# Patient Record
Sex: Male | Born: 1960 | Race: White | Hispanic: No | Marital: Married | Smoking: Former smoker
Health system: Southern US, Community
[De-identification: ages and names within clinical notes are randomized; demographics above are authoritative.]

## PROBLEM LIST (undated history)

## (undated) HISTORY — PX: OTHER SURGICAL HISTORY: SHX169

---

## 2001-12-22 ENCOUNTER — Emergency Department (HOSPITAL_COMMUNITY): Admission: EM | Admit: 2001-12-22 | Discharge: 2001-12-22 | Payer: Self-pay | Admitting: Emergency Medicine

## 2001-12-22 ENCOUNTER — Encounter: Payer: Self-pay | Admitting: Emergency Medicine

## 2014-08-17 ENCOUNTER — Emergency Department (HOSPITAL_COMMUNITY): Payer: BLUE CROSS/BLUE SHIELD

## 2014-08-17 ENCOUNTER — Encounter (HOSPITAL_COMMUNITY): Payer: Self-pay | Admitting: *Deleted

## 2014-08-17 ENCOUNTER — Emergency Department (HOSPITAL_COMMUNITY)
Admission: EM | Admit: 2014-08-17 | Discharge: 2014-08-17 | Disposition: A | Discharge status: Home / Self Care | Payer: BLUE CROSS/BLUE SHIELD | Attending: Emergency Medicine | Admitting: Emergency Medicine

## 2014-08-17 DIAGNOSIS — B349 Viral infection, unspecified: Secondary | ICD-10-CM | POA: Insufficient documentation

## 2014-08-17 DIAGNOSIS — Z720 Tobacco use: Secondary | ICD-10-CM | POA: Insufficient documentation

## 2014-08-17 DIAGNOSIS — R1110 Vomiting, unspecified: Secondary | ICD-10-CM | POA: Diagnosis present

## 2014-08-17 LAB — COMPREHENSIVE METABOLIC PANEL
ALK PHOS: 117 U/L (ref 38–126)
ALT: 25 U/L (ref 17–63)
AST: 26 U/L (ref 15–41)
Albumin: 4.4 g/dL (ref 3.5–5.0)
Anion gap: 11 (ref 5–15)
BUN: 24 mg/dL — ABNORMAL HIGH (ref 6–20)
CALCIUM: 9.1 mg/dL (ref 8.9–10.3)
CO2: 21 mmol/L — AB (ref 22–32)
Chloride: 107 mmol/L (ref 101–111)
Creatinine, Ser: 1.02 mg/dL (ref 0.61–1.24)
GFR calc Af Amer: >60 mL/min (ref >60)
Glucose, Bld: 113 mg/dL — ABNORMAL HIGH (ref 65–99)
POTASSIUM: 3.9 mmol/L (ref 3.5–5.1)
SODIUM: 139 mmol/L (ref 135–145)
TOTAL PROTEIN: 7.6 g/dL (ref 6.5–8.1)
Total Bilirubin: 1 mg/dL (ref 0.3–1.2)

## 2014-08-17 LAB — CBC WITH DIFFERENTIAL/PLATELET
Basophils Absolute: 0 10*3/uL (ref 0.0–0.1)
Basophils Relative: 0 % (ref 0–1)
EOS ABS: 0 10*3/uL (ref 0.0–0.7)
EOS PCT: 0 % (ref 0–5)
HCT: 52 % (ref 39.0–52.0)
Hemoglobin: 17.6 g/dL — ABNORMAL HIGH (ref 13.0–17.0)
LYMPHS ABS: 0.1 10*3/uL — AB (ref 0.7–4.0)
Lymphocytes Relative: 2 % — ABNORMAL LOW (ref 12–46)
MCH: 30.9 pg (ref 26.0–34.0)
MCHC: 33.8 g/dL (ref 30.0–36.0)
MCV: 91.4 fL (ref 78.0–100.0)
MONOS PCT: 1 % — AB (ref 3–12)
Monocytes Absolute: 0.1 10*3/uL (ref 0.1–1.0)
Neutro Abs: 6.8 10*3/uL (ref 1.7–7.7)
Neutrophils Relative %: 97 % — ABNORMAL HIGH (ref 43–77)
PLATELETS: 215 10*3/uL (ref 150–400)
RBC: 5.69 MIL/uL (ref 4.22–5.81)
RDW: 13.3 % (ref 11.5–15.5)
WBC: 7 10*3/uL (ref 4.0–10.5)

## 2014-08-17 LAB — URINALYSIS, ROUTINE W REFLEX MICROSCOPIC
BILIRUBIN URINE: NEGATIVE
GLUCOSE, UA: NEGATIVE mg/dL
HGB URINE DIPSTICK: NEGATIVE
KETONES UR: NEGATIVE mg/dL
Leukocytes, UA: NEGATIVE
Nitrite: NEGATIVE
PH: 6 (ref 5.0–8.0)
Protein, ur: NEGATIVE mg/dL
SPECIFIC GRAVITY, URINE: 1.025 (ref 1.005–1.030)
Urobilinogen, UA: 0.2 mg/dL (ref 0.0–1.0)

## 2014-08-17 MED ORDER — SODIUM CHLORIDE 0.9 % IV SOLN
1000.0000 mL | Freq: Once | INTRAVENOUS | Status: AC
  Administered 2014-08-17: 1000 mL via INTRAVENOUS

## 2014-08-17 MED ORDER — ONDANSETRON HCL 4 MG PO TABS
4.0000 mg | ORAL_TABLET | Freq: Four times a day (QID) | ORAL | Status: DC
Start: 2014-08-17 — End: 2017-06-03

## 2014-08-17 MED ORDER — IBUPROFEN 800 MG PO TABS: 800.0000 mg | ORAL_TABLET | Freq: Three times a day (TID) | ORAL | Status: DC

## 2014-08-17 MED ORDER — ONDANSETRON HCL 4 MG/2ML IJ SOLN
4.0000 mg | Freq: Once | INTRAMUSCULAR | Status: AC
  Administered 2014-08-17: 4 mg via INTRAVENOUS
  Filled 2014-08-17: qty 2

## 2014-08-17 NOTE — ED Notes (Signed)
Pt states he removed 2 ticks on Sunday. Body aches began Sunday. Pt states he also had done a lot of work Sunday as well. Vomiting began at 0700 this morning. Body aches continue. Denies any other symptoms at this time.

## 2014-08-17 NOTE — ED Notes (Signed)
Gave pt a ginger ale. Pt tolerating well at this time.

## 2014-08-17 NOTE — ED Provider Notes (Signed)
CSN: [id number]     Arrival date & time 08/17/14  [id number] History   First MD Initiated Contact with Patient 08/17/14 0857     Chief Complaint  Patient presents with  . Emesis     (Consider location/radiation/quality/duration/timing/severity/associated sxs/prior Treatment) HPI Comments: Joints aching for 3 days. This AM 7:00 chills and vomiting.  Patient is a 54 y.o. male presenting with vomiting. The history is provided by the patient.  Emesis Severity:  Moderate Duration:  3 hours Timing:  Intermittent Quality:  Stomach contents Progression:  Worsening Chronicity:  New Recent urination:  Normal Context: not post-tussive   Relieved by:  Nothing Worsened by:  Nothing tried Associated symptoms: chills and myalgias   Associated symptoms: no abdominal pain, no diarrhea and no sore throat   Risk factors: alcohol use and sick contacts   Risk factors: no suspect food intake     History reviewed. No pertinent past medical history. Past Surgical History  Procedure Laterality Date  . Orhtopedic surgery     No family history on file. History  Substance Use Topics  . Smoking status: Current Every Day Smoker    Types: Cigarettes  . Smokeless tobacco: Not on file  . Alcohol Use: Yes     Comment: daily. Beer.     Review of Systems  Constitutional: Positive for chills.  HENT: Negative for sore throat.   Gastrointestinal: Positive for vomiting. Negative for abdominal pain and diarrhea.  Musculoskeletal: Positive for myalgias.  All other systems reviewed and are negative.     Allergies  Review of patient's allergies indicates no known allergies.  Home Medications   Prior to Admission medications   Not on File   There were no vitals taken for this visit. Physical Exam  Constitutional: He is oriented to person, place, and time. He appears well-developed and well-nourished.  Non-toxic appearance.  HENT:  Head: Normocephalic.  Right Ear: Tympanic membrane and external ear  normal.  Left Ear: Tympanic membrane and external ear normal.  Eyes: EOM and lids are normal. Pupils are equal, round, and reactive to light.  Neck: Normal range of motion. Neck supple. Carotid bruit is not present.  Cardiovascular: Regular rhythm, normal heart sounds, intact distal pulses and normal pulses.  Tachycardia present.   Pulmonary/Chest: Breath sounds normal. No respiratory distress. He has no wheezes.  Abdominal: Soft. Bowel sounds are normal. There is no tenderness. There is no guarding.  Musculoskeletal: Normal range of motion. He exhibits no edema.  Lymphadenopathy:       Head (right side): No submandibular adenopathy present.       Head (left side): No submandibular adenopathy present.    He has no cervical adenopathy.  Neurological: He is alert and oriented to person, place, and time. He has normal strength. No cranial nerve deficit or sensory deficit. He exhibits normal muscle tone. Coordination normal.  Skin: Skin is warm and dry.  Psychiatric: He has a normal mood and affect. His speech is normal.  Nursing note and vitals reviewed.   ED Course  Procedures (including critical care time) Labs Review Labs Reviewed  CBC WITH DIFFERENTIAL/PLATELET  COMPREHENSIVE METABOLIC PANEL    Imaging Review No results found.   EKG Interpretation None      MDM Chest x-ray shows no acute cardiopulmonary disease. Urinalysis is well within normal limits. Complains of metabolic panel shows BUN to be elevated at 24, otherwise none contributory. Patient was tachycardic at 130 239 bpm initially upon admission. After IV fluids and medications,  the patient significantly improved 215. The patient is ambulatory without problem at discharge. The patient is able to tolerate ginger ale without throwing up. Suspect a viral illness. Patient will be treated with Zofran and ibuprofen. He is to return immediately to the emergency department if any changes, problems, or concerns.    Final  diagnoses:  Viral illness    **I have reviewed nursing notes, vital signs, and all appropriate lab and imaging results for this patient.Ivery Quale*    Yoona Ishii, PA-C 08/22/14 1207  Samuel JesterKathleen McManus, DO 08/23/14 1109

## 2014-08-17 NOTE — Discharge Instructions (Signed)
Your chest x-ray is negative for acute event. Your labs are negative for acute problem. Your examination suggest viral illness. Please increase water, juices, Gatorade. Please use ibuprofen 3 times daily for ibuprofen aching or fever. Please see your primary physician or return to the emergency department if any changes, problems, or concerns. Please use a mask until symptoms have resolved. And please wash hands frequently. Viral Infections A virus is a type of germ. Viruses can cause:  Minor sore throats.  Aches and pains.  Headaches.  Runny nose.  Rashes.  Watery eyes.  Tiredness.  Coughs.  Loss of appetite.  Feeling sick to your stomach (nausea).  Throwing up (vomiting).  Watery poop (diarrhea). HOME CARE   Only take medicines as told by your doctor.  Drink enough water and fluids to keep your pee (urine) clear or pale yellow. Sports drinks are a good choice.  Get plenty of rest and eat healthy. Soups and broths with crackers or rice are fine. GET HELP RIGHT AWAY IF:   You have a very bad headache.  You have shortness of breath.  You have chest pain or neck pain.  You have an unusual rash.  You cannot stop throwing up.  You have watery poop that does not stop.  You cannot keep fluids down.  You or your child has a temperature by mouth above 102 F (38.9 C), not controlled by medicine.  Your baby is older than 3 months with a rectal temperature of 102 F (38.9 C) or higher.  Your baby is 773 months old or younger with a rectal temperature of 100.4 F (38 C) or higher. MAKE SURE YOU:   Understand these instructions.  Will watch this condition.  Will get help right away if you are not doing well or get worse. Document Released: 03/06/2008 Document Revised: 06/16/2011 Document Reviewed: 07/30/2010 [facility] Patient Information 2015 [place], [place]. This information is not intended to replace advice given to you by your health care provider. Make sure you  discuss any questions you have with your health care provider.

## 2014-08-17 NOTE — ED Notes (Signed)
PA at bedside.

## 2014-08-17 NOTE — ED Notes (Signed)
Patient was ambulatory to restroom.

## 2017-06-03 ENCOUNTER — Encounter (HOSPITAL_COMMUNITY): Payer: Self-pay | Admitting: Emergency Medicine

## 2017-06-03 ENCOUNTER — Emergency Department (HOSPITAL_COMMUNITY): Payer: Self-pay

## 2017-06-03 ENCOUNTER — Observation Stay (HOSPITAL_COMMUNITY)
Admission: EM | Admit: 2017-06-03 | Discharge: 2017-06-04 | Disposition: A | Discharge status: Home / Self Care | Payer: Self-pay | Attending: Family Medicine | Admitting: Family Medicine

## 2017-06-03 ENCOUNTER — Other Ambulatory Visit: Payer: Self-pay

## 2017-06-03 DIAGNOSIS — I6310 Cerebral infarction due to embolism of unspecified precerebral artery: Secondary | ICD-10-CM

## 2017-06-03 DIAGNOSIS — R030 Elevated blood-pressure reading, without diagnosis of hypertension: Secondary | ICD-10-CM | POA: Insufficient documentation

## 2017-06-03 DIAGNOSIS — Z87891 Personal history of nicotine dependence: Secondary | ICD-10-CM | POA: Insufficient documentation

## 2017-06-03 DIAGNOSIS — G459 Transient cerebral ischemic attack, unspecified: Principal | ICD-10-CM | POA: Insufficient documentation

## 2017-06-03 DIAGNOSIS — R739 Hyperglycemia, unspecified: Secondary | ICD-10-CM | POA: Insufficient documentation

## 2017-06-03 DIAGNOSIS — I639 Cerebral infarction, unspecified: Secondary | ICD-10-CM | POA: Diagnosis present

## 2017-06-03 LAB — I-STAT CHEM 8, ED
BUN: 12 mg/dL (ref 6–20)
CALCIUM ION: 1.15 mmol/L (ref 1.15–1.40)
CHLORIDE: 101 mmol/L (ref 101–111)
Creatinine, Ser: 0.9 mg/dL (ref 0.61–1.24)
GLUCOSE: 144 mg/dL — AB (ref 65–99)
HCT: 52 % (ref 39.0–52.0)
Hemoglobin: 17.7 g/dL — ABNORMAL HIGH (ref 13.0–17.0)
Potassium: 3.7 mmol/L (ref 3.5–5.1)
Sodium: 140 mmol/L (ref 135–145)
TCO2: 29 mmol/L (ref 22–32)

## 2017-06-03 LAB — URINALYSIS, ROUTINE W REFLEX MICROSCOPIC
Bilirubin Urine: NEGATIVE
GLUCOSE, UA: NEGATIVE mg/dL
Hgb urine dipstick: NEGATIVE
Ketones, ur: NEGATIVE mg/dL
Leukocytes, UA: NEGATIVE
Nitrite: NEGATIVE
Protein, ur: NEGATIVE mg/dL
SPECIFIC GRAVITY, URINE: 1.009 (ref 1.005–1.030)
pH: 5 (ref 5.0–8.0)

## 2017-06-03 LAB — DIFFERENTIAL
Basophils Absolute: 0 10*3/uL (ref 0.0–0.1)
Basophils Relative: 0 %
Eosinophils Absolute: 0.3 10*3/uL (ref 0.0–0.7)
Eosinophils Relative: 3 %
LYMPHS ABS: 2.3 10*3/uL (ref 0.7–4.0)
Lymphocytes Relative: 25 %
Monocytes Absolute: 0.5 10*3/uL (ref 0.1–1.0)
Monocytes Relative: 6 %
NEUTROS ABS: 6.2 10*3/uL (ref 1.7–7.7)
Neutrophils Relative %: 66 %

## 2017-06-03 LAB — RAPID URINE DRUG SCREEN, HOSP PERFORMED
Amphetamines: NOT DETECTED
Barbiturates: NOT DETECTED
Benzodiazepines: NOT DETECTED
Cocaine: NOT DETECTED
Opiates: NOT DETECTED
Tetrahydrocannabinol: NOT DETECTED

## 2017-06-03 LAB — COMPREHENSIVE METABOLIC PANEL
ALT: 30 U/L (ref 17–63)
AST: 28 U/L (ref 15–41)
Albumin: 4.2 g/dL (ref 3.5–5.0)
Alkaline Phosphatase: 80 U/L (ref 38–126)
Anion gap: 10 (ref 5–15)
BUN: 12 mg/dL (ref 6–20)
CALCIUM: 9.3 mg/dL (ref 8.9–10.3)
CO2: 27 mmol/L (ref 22–32)
Chloride: 102 mmol/L (ref 101–111)
Creatinine, Ser: 1.03 mg/dL (ref 0.61–1.24)
Glucose, Bld: 151 mg/dL — ABNORMAL HIGH (ref 65–99)
Potassium: 3.9 mmol/L (ref 3.5–5.1)
Sodium: 139 mmol/L (ref 135–145)
Total Bilirubin: 0.4 mg/dL (ref 0.3–1.2)
Total Protein: 7.6 g/dL (ref 6.5–8.1)

## 2017-06-03 LAB — CBC
HEMATOCRIT: 49.9 % (ref 39.0–52.0)
Hemoglobin: 16.8 g/dL (ref 13.0–17.0)
MCH: 31.6 pg (ref 26.0–34.0)
MCHC: 33.7 g/dL (ref 30.0–36.0)
MCV: 93.8 fL (ref 78.0–100.0)
Platelets: 291 10*3/uL (ref 150–400)
RBC: 5.32 MIL/uL (ref 4.22–5.81)
RDW: 12.8 % (ref 11.5–15.5)
WBC: 9.3 10*3/uL (ref 4.0–10.5)

## 2017-06-03 LAB — I-STAT TROPONIN, ED: Troponin i, poc: 0 ng/mL (ref 0.00–0.08)

## 2017-06-03 LAB — PROTIME-INR
INR: 0.95
Prothrombin Time: 12.6 seconds (ref 11.4–15.2)

## 2017-06-03 LAB — ETHANOL: Alcohol, Ethyl (B): <10 mg/dL (ref <10)

## 2017-06-03 LAB — APTT: aPTT: 39 seconds — ABNORMAL HIGH (ref 24–36)

## 2017-06-03 MED ORDER — STROKE: EARLY STAGES OF RECOVERY BOOK
Freq: Once | Status: DC
  Filled 2017-06-03: qty 1

## 2017-06-03 MED ORDER — ONDANSETRON HCL 4 MG/2ML IJ SOLN: 4.0000 mg | Freq: Four times a day (QID) | INTRAMUSCULAR | Status: DC | PRN

## 2017-06-03 MED ORDER — INSULIN ASPART 100 UNIT/ML ~~LOC~~ SOLN
0.0000 [IU] | Freq: Three times a day (TID) | SUBCUTANEOUS | Status: DC
  Administered 2017-06-04: 1 [IU] via SUBCUTANEOUS

## 2017-06-03 MED ORDER — NAPROXEN SODIUM 275 MG PO TABS
275.0000 mg | ORAL_TABLET | Freq: Every day | ORAL | Status: DC | PRN
  Filled 2017-06-03: qty 1

## 2017-06-03 MED ORDER — ASPIRIN 300 MG RE SUPP: 300.0000 mg | Freq: Every day | RECTAL | Status: DC

## 2017-06-03 MED ORDER — ONDANSETRON HCL 4 MG PO TABS: 4.0000 mg | ORAL_TABLET | Freq: Four times a day (QID) | ORAL | Status: DC | PRN

## 2017-06-03 MED ORDER — SALINE SPRAY 0.65 % NA SOLN: 1.0000 | NASAL | Status: DC | PRN

## 2017-06-03 MED ORDER — ASPIRIN 325 MG PO TABS
325.0000 mg | ORAL_TABLET | Freq: Every day | ORAL | Status: DC
  Administered 2017-06-04: 325 mg via ORAL
  Filled 2017-06-03: qty 1

## 2017-06-03 MED ORDER — ENOXAPARIN SODIUM 40 MG/0.4ML ~~LOC~~ SOLN
40.0000 mg | SUBCUTANEOUS | Status: DC
  Administered 2017-06-03: 40 mg via SUBCUTANEOUS
  Filled 2017-06-03: qty 0.4

## 2017-06-03 MED ORDER — VITAMIN B-12 100 MCG PO TABS
100.0000 ug | ORAL_TABLET | Freq: Every day | ORAL | Status: DC
  Administered 2017-06-03 – 2017-06-04 (×2): 100 ug via ORAL
  Filled 2017-06-03 (×2): qty 1

## 2017-06-03 MED ORDER — SODIUM CHLORIDE 0.9% FLUSH
3.0000 mL | Freq: Two times a day (BID) | INTRAVENOUS | Status: DC
  Administered 2017-06-03 – 2017-06-04 (×2): 3 mL via INTRAVENOUS

## 2017-06-03 MED ORDER — ACETAMINOPHEN 650 MG RE SUPP: 650.0000 mg | Freq: Four times a day (QID) | RECTAL | Status: DC | PRN

## 2017-06-03 MED ORDER — ADULT MULTIVITAMIN W/MINERALS CH
1.0000 | ORAL_TABLET | Freq: Every day | ORAL | Status: DC
  Administered 2017-06-03 – 2017-06-04 (×2): 1 via ORAL
  Filled 2017-06-03 (×2): qty 1

## 2017-06-03 MED ORDER — POTASSIUM CHLORIDE IN NACL 20-0.9 MEQ/L-% IV SOLN
INTRAVENOUS | Status: DC
  Administered 2017-06-03: 20:00:00 via INTRAVENOUS

## 2017-06-03 MED ORDER — ACETAMINOPHEN 325 MG PO TABS: 650.0000 mg | ORAL_TABLET | Freq: Four times a day (QID) | ORAL | Status: DC | PRN

## 2017-06-03 NOTE — ED Notes (Signed)
ED Provider at bedside. 

## 2017-06-03 NOTE — H&P (Addendum)
History and Physical    Terry Hardin WUJ:[id number] DOB: 08-Mar-1961 DOA: 06/03/2017  PCP: [facility]   Patient coming from: Home  I have personally briefly reviewed patient's old medical records in [facility]  Chief Complaint:  weakness in his left arm lasting 15 minutes  HPI: Terry Hardin is a 57 y.o. 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  He states he had associated symptoms of feeling like cloudy in his head.  The patient is a very stoic gentleman and never goes to see the doctor.  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  Because of the persistent cloudy feeling in his head the patient decided to come into the emergency department for further evaluation and management and a code stroke was called.  She was seen in consultation by Dr. Zachery Conch from telemetry neurology felt that the patient was having a TIA possible stroke.  Recommended further evaluation and management. Patient was not a candidate for TPA due to an NIH stroke score 0 which was 0.  Lobectomy was not recommended because his presentation was not suggestive of a large vessel occlusive disease.  Neurology recommended observation in the hospital daily antithrombotics to be started and consultation with neurology.  Patient is therefore going to be placed in overnight observation for further evaluation and management.  Upon my evaluation patient stated he is back to normal.  Review of Systems: As per HPI otherwise complete review of systems negative.   History reviewed. No pertinent past medical  history.  Past Surgical History:  Procedure Laterality Date  . orhtopedic surgery       reports that he has quit smoking. His smoking use included cigarettes. he has never used smokeless tobacco. He reports that he drinks alcohol. He reports that he does not use drugs.  No Known Allergies  History reviewed. No pertinent family history. Patient's mother had hypertension Patient's father is in good health at 35 years old.  Prior to Admission medications   Medication Sig Start Date End Date Taking? Authorizing Provider  Multiple Vitamin (MULTIVITAMIN WITH MINERALS) TABS tablet Take 1 tablet by mouth daily.   Yes [provider]  naproxen sodium (ANAPROX) 220 MG tablet Take 220 mg by mouth daily as needed.   Yes [provider]  sodium chloride (OCEAN) 0.65 % SOLN nasal spray Place 1 spray into both nostrils as needed for congestion.   Yes [provider]  vitamin B-12 (CYANOCOBALAMIN) 100 MCG tablet Take 100 mcg by mouth daily.   Yes [provider]    Physical Exam: Vitals:   06/03/17 1600 06/03/17 1615 06/03/17 1645 06/03/17 1743  BP: 132/82 (!) 144/84 137/85 (!) 149/84  Pulse: 67 (!) 58 (!) 55 (!) 57  Resp: 13 15 19 18   Temp:    97.6 F (36.4 C)  TempSrc:    Oral  SpO2: 94% 96% 98% 100%   .TCS Constitutional: NAD, calm, comfortable, patient states he is back to normal now Vitals:   06/03/17 1600 06/03/17 1615 06/03/17 1645 06/03/17 1743  BP: 132/82 (!) 144/84 137/85 (!) 149/84  Pulse:  67 (!) 58 (!) 55 (!) 57  Resp: 13 15 19 18   Temp:    97.6 F (36.4 C)  TempSrc:    Oral  SpO2: 94% 96% 98% 100%   Eyes: PERRL, lids and conjunctivae normal ENMT: Mucous membranes are moist. Posterior pharynx clear of any exudate or lesions.Normal dentition.  Neck: normal, supple, no masses, no thyromegaly Respiratory: clear to auscultation bilaterally, no wheezing, no crackles. Normal respiratory effort. No accessory muscle use.  Cardiovascular:  Regular rate and rhythm, no murmurs / rubs / gallops. No extremity edema. 2+ pedal pulses. No carotid bruits.  Abdomen: no tenderness, no masses palpated. No hepatosplenomegaly. Bowel sounds positive.  Musculoskeletal: no clubbing / cyanosis. No joint deformity upper and lower extremities. Good ROM, no contractures. Normal muscle tone.  Skin: no rashes, lesions, ulcers. No induration Neurologic: CN 2-12 grossly intact. Sensation intact, DTR normal. Strength 5/5 in all 4.  Psychiatric: Normal judgment and insight. Alert and oriented x 3. Normal mood.    Labs on Admission: I have personally reviewed following labs and imaging studies  CBC: Recent Labs  Lab 06/03/17 1327 06/03/17 1348  WBC 9.3  --   NEUTROABS 6.2  --   HGB 16.8 17.7*  HCT 49.9 52.0  MCV 93.8  --   PLT 291  --    Basic Metabolic Panel: Recent Labs  Lab 06/03/17 1327 06/03/17 1348  NA 139 140  K 3.9 3.7  CL 102 101  CO2 27  --   GLUCOSE 151* 144*  BUN 12 12  CREATININE 1.03 0.90  CALCIUM 9.3  --    GFR: CrCl cannot be calculated (Unknown ideal weight.). Liver Function Tests: Recent Labs  Lab 06/03/17 1327  AST 28  ALT 30  ALKPHOS 80  BILITOT 0.4  PROT 7.6  ALBUMIN 4.2   No results for input(s): LIPASE, AMYLASE in the last 168 hours. No results for input(s): AMMONIA in the last 168 hours. Coagulation Profile: Recent Labs  Lab 06/03/17 1327  INR 0.95   Cardiac Enzymes: No results for input(s): CKTOTAL, CKMB, CKMBINDEX, TROPONINI in the last 168 hours. BNP (last 3 results) No results for input(s): PROBNP in the last 8760 hours. HbA1C: No results for input(s): HGBA1C in the last 72 hours. CBG: No results for input(s): GLUCAP in the last 168 hours. Lipid Profile: No results for input(s): CHOL, HDL, LDLCALC, TRIG, CHOLHDL, LDLDIRECT in the last 72 hours. Thyroid Function Tests: No results for input(s): TSH, T4TOTAL, FREET4, T3FREE, THYROIDAB in the last 72 hours. Anemia Panel: No results for  input(s): VITAMINB12, FOLATE, FERRITIN, TIBC, IRON, RETICCTPCT in the last 72 hours. Urine analysis:    Component Value Date/Time   COLORURINE YELLOW 06/03/2017 1330   APPEARANCEUR CLEAR 06/03/2017 1330   LABSPEC 1.009 06/03/2017 1330   PHURINE 5.0 06/03/2017 1330   GLUCOSEU NEGATIVE 06/03/2017 1330   HGBUR NEGATIVE 06/03/2017 1330   BILIRUBINUR NEGATIVE 06/03/2017 1330   KETONESUR NEGATIVE 06/03/2017 1330   PROTEINUR NEGATIVE 06/03/2017 1330   UROBILINOGEN 0.2 08/17/2014 0918   NITRITE NEGATIVE 06/03/2017 1330   LEUKOCYTESUR NEGATIVE 06/03/2017 1330    Radiological Exams on Admission: Ct Head Code Stroke Wo Contrast  Result Date: 06/03/2017 CLINICAL DATA:  Code stroke.  Left lip numbness. EXAM: CT HEAD WITHOUT CONTRAST TECHNIQUE: Contiguous axial images were obtained from the base of the skull through the vertex without intravenous contrast. COMPARISON:  None. FINDINGS: Brain: Normal appearance without evidence of malformation, atrophy, old or acute infarction, mass lesion, hemorrhage, hydrocephalus  or extra-axial collection. Vascular: There is atherosclerotic calcification of the major vessels at the base of the brain. Skull: Negative Sinuses/Orbits: Clear/normal Other: None ASPECTS (Alberta Stroke Program Early CT Score) - Ganglionic level infarction (caudate, lentiform nuclei, internal capsule, insula, M1-M3 cortex): 7 - Supraganglionic infarction (M4-M6 cortex): 3 Total score (0-10 with 10 being normal): 10 IMPRESSION: 1. Negative head CT. Normal appearance of the brain. Mild atherosclerotic calcification of the major vessels at the base of the brain. 2. ASPECTS is 10. 3. These results were called by telephone at the time of interpretation on 06/03/2017 at 1:36 pm to Dr. Bethann BerkshireJOSEPH ZAMMIT , who verbally acknowledged these results. Electronically Signed   By: Paulina FusiMark  Shogry M.D.   On: 06/03/2017 13:37    EKG: Independently reviewed.  Normal sinus rhythm with possibly old anterior abnormalities  no prior for comparison  Assessment/Plan Principal Problem:   Stroke (cerebrum) (HCC) Active Problems:   Hyperglycemia   Elevated blood pressure reading without diagnosis of hypertension  1.  Stroke versus TIA: Patient will be admitted into the hospital we will check an MRI of the brain.  We will check bilateral carotid Dopplers as well.  We will start him on aspirin daily as well as a statin.  We will check a lipid profile in a.m.  Given patient's elevated blood pressure will monitor his cuff pressures closely and allow for permissive hypertension.  2.  Hyperglycemia: We will check fingerstick blood glucoses as well as hemoglobin A1c and cover with sliding scale if necessary.  Patient does not have a diagnosis of diabetes type 2 yet.  3.  Elevated blood pressure reading without diagnosis of hypertension: We will monitor blood pressures right now if blood pressures remain elevated in the next couple of weeks it may be necessary to begin antihypertensive therapy.  4.  Possible sleep apnea: Wife reports that patient snores in his sleep and stops breathing: Patient will likely need outpatient sleep study.  DVT prophylaxis: Lovenox Code Status: Full Family Communication: Spoke with patient and his wife was present at the bedside Disposition Plan: Likely home Consults called: Neurology Admission status: Observation   Lahoma Crockerheresa C Toshi Ishii MD FACP [facility] Pager [phone]  If 7PM-7AM, please contact night-coverage [email or web address] Password [email or web address]  06/03/2017, 8:15 PM

## 2017-06-03 NOTE — ED Triage Notes (Signed)
Left arm tingling numbness with lower lip numbness since 1030. Pt took two asa around 11am. Pt denies other symptoms. MD notified.

## 2017-06-03 NOTE — Consult Note (Addendum)
  [facility] TeleNeurology Consult Services Asked to see this patient in telemedicine consultation. Consultation was performed with assistance of ancillary / medical staff at bedside.  Impression: Stroke - TIA (left arm and lower lip paresthesias - improved)  Differential Diagnosis: 1. Cardioembolic stroke 2. Small vessel disease/lacune 3. Thromboembolic, artery-to-artery mechanism 4. Hypercoagulable state-related infarct 5. Thrombotic mechanism, large artery disease  Presentation: * Symptoms: 57 year old man presents with acute left arm and lower lip numbness, which improved at about 1340.  No associated weakness of the extremities or speech difficulties.  PMH: unremarkable.  * Last Known Well: 1030 * History provided by: Patient * History of anticoagulation or other contraindications for thrombolytics: No  Telestroke Assessment: Patient is in no apparent distress. Patient appears as stated age. No obvious acute respiratory or cardiac distress. Patient is well groomed and well-nourished.  NIH Stroke Scale/Score (NIHSS) on 06/03/2017   RESULT SUMMARY: 0 points NIH Stroke Scale   INPUTS: 1A: Level of consciousness -> 0 = Alert; keenly responsive 1B: Ask month and age -> 0 = Both questions right 1C: 'Blink eyes' & 'squeeze hands' -> 0 = Performs both tasks 2: Horizontal extraocular movements -> 0 = Normal 3: Visual fields -> 0 = No visual loss 4: Facial palsy -> 0 = Normal symmetry 5A: Left arm motor drift -> 0 = No drift for 10 seconds 5B: Right arm motor drift -> 0 = No drift for 10 seconds 6A: Left leg motor drift -> 0 = No drift for 5 seconds 6B: Right leg motor drift -> 0 = No drift for 5 seconds 7: Limb Ataxia -> 0 = No ataxia 8: Sensation -> 0 = Normal; no sensory loss 9: Language/aphasia -> 0 = Normal; no aphasia 10: Dysarthria -> 0 = Normal 11: Extinction/inattention -> 0 = No abnormality   Comments:  Door time: 1310 [facility] contacted:  1343 [facility] first log in: 1348 NIHSS assessment time: 1352  Blood glucose and blood pressure within acceptable parameters  CT head reviewed, and there is no indication of acute hemorrhage or mass effect.  Medical Decision Making: * Patient is not candidate for Alteplase due to NIHSS 0 and improved symptoms. * Presentation is not suggestive of large vessel occlusive disease. Thrombectomy would not be recommended.  Recommendations: 1. Daily antithrombotics to be started. 2. Please consult with Neurology to see for routine follow up.  Discussed with ED physician. Please call with questions.  Dr. Tori MilksMark N. Zachery ConchFriedman [facility]  Medical Decision Making: - Extensive number of diagnosis or management options are considered above. - Extensive amount of complex data reviewed. - High risk of complication and/or morbidity or mortality are associated with differential diagnostic considerations above. - There may be uncertain outcome and increased probability of prolonged functional impairment or high probability of severe prolonged functional impairment associated with some of these differential diagnosis. Medical Data Reviewed: 1. Data reviewed include clinical labs, radiology, medical tests; 2. Tests results discussed w/performing or interpreting physician; 3. Obtaining/reviewing old medical records; 4. Obtaining case history from another source; 5. Independent review of images.

## 2017-06-03 NOTE — ED Provider Notes (Signed)
[facility] EMERGENCY DEPARTMENT Provider Note   CSN: [id number] Arrival date & time: 06/03/17  1310   An emergency department physician performed an initial assessment on this suspected stroke patient at 1326.  History   Chief Complaint Chief Complaint  Patient presents with  . Code Stroke    HPI Eutimio Gharibian is a 57 y.o. male.  The patient states that he had some numbness to his lower lip and also some weakness to his left arm for about 15 minutes.  He feels like he is back to normal now   The history is provided by the patient. No language interpreter was used.  Illness  This is a new problem. The current episode started 3 to 5 hours ago. The problem occurs constantly. The problem has been resolved. Pertinent negatives include no chest pain, no abdominal pain and no headaches. Nothing aggravates the symptoms. Nothing relieves the symptoms. He has tried nothing for the symptoms. The treatment provided no relief.    History reviewed. No pertinent past medical history.  There are no active problems to display for this patient.   Past Surgical History:  Procedure Laterality Date  . orhtopedic surgery         Home Medications    Prior to Admission medications   Medication Sig Start Date End Date Taking? Authorizing Provider  Multiple Vitamin (MULTIVITAMIN WITH MINERALS) TABS tablet Take 1 tablet by mouth daily.   Yes [provider]  naproxen sodium (ANAPROX) 220 MG tablet Take 220 mg by mouth daily as needed.   Yes [provider]  sodium chloride (OCEAN) 0.65 % SOLN nasal spray Place 1 spray into both nostrils as needed for congestion.   Yes [provider]  vitamin B-12 (CYANOCOBALAMIN) 100 MCG tablet Take 100 mcg by mouth daily.   Yes [provider]    Family History History reviewed. No pertinent family history.  Social History Social History   Tobacco Use  . Smoking status: Former Smoker    Types: Cigarettes  .  Smokeless tobacco: Never Used  Substance Use Topics  . Alcohol use: Yes    Comment: daily. Beer.   . Drug use: No     Allergies   Patient has no known allergies.   Review of Systems Review of Systems  Constitutional: Negative for appetite change and fatigue.  HENT: Negative for congestion, ear discharge and sinus pressure.   Eyes: Negative for discharge.  Respiratory: Negative for cough.   Cardiovascular: Negative for chest pain.  Gastrointestinal: Negative for abdominal pain and diarrhea.  Genitourinary: Negative for frequency and hematuria.  Musculoskeletal: Negative for back pain.  Skin: Negative for rash.  Neurological: Negative for seizures and headaches.       Numbness in lower lip and weakness in left arm  Psychiatric/Behavioral: Negative for hallucinations.     Physical Exam Updated Vital Signs BP (!) 155/92   Pulse 61   Temp 98.5 F (36.9 C)   Resp 12   SpO2 97%   Physical Exam  Constitutional: He is oriented to person, place, and time. He appears well-developed.  HENT:  Head: Normocephalic.  Eyes: Conjunctivae and EOM are normal. No scleral icterus.  Neck: Neck supple. No thyromegaly present.  Cardiovascular: Normal rate and regular rhythm. Exam reveals no gallop and no friction rub.  No murmur heard. Pulmonary/Chest: No stridor. He has no wheezes. He has no rales. He exhibits no tenderness.  Abdominal: He exhibits no distension. There is no tenderness. There is  no rebound.  Musculoskeletal: Normal range of motion. He exhibits no edema.  Lymphadenopathy:    He has no cervical adenopathy.  Neurological: He is oriented to person, place, and time. He exhibits normal muscle tone. Coordination normal.  Skin: No rash noted. No erythema.  Psychiatric: He has a normal mood and affect. His behavior is normal.     ED Treatments / Results  Labs (all labs ordered are listed, but only abnormal results are displayed) Labs Reviewed  APTT - Abnormal; Notable for  the following components:      Result Value   aPTT 39 (*)    All other components within normal limits  COMPREHENSIVE METABOLIC PANEL - Abnormal; Notable for the following components:   Glucose, Bld 151 (*)    All other components within normal limits  I-STAT CHEM 8, ED - Abnormal; Notable for the following components:   Glucose, Bld 144 (*)    Hemoglobin 17.7 (*)    All other components within normal limits  ETHANOL  PROTIME-INR  CBC  DIFFERENTIAL  RAPID URINE DRUG SCREEN, HOSP PERFORMED  URINALYSIS, ROUTINE W REFLEX MICROSCOPIC  I-STAT TROPONIN, ED    EKG  EKG Interpretation  Date/Time:  Wednesday June 03 2017 13:50:50 EST Ventricular Rate:  73 PR Interval:    QRS Duration: 95 QT Interval:  399 QTC Calculation: 440 R Axis:   33 Text Interpretation:  Sinus rhythm Anterior infarct, old Baseline wander in lead(s) V5 Confirmed by Bethann BerkshireZammit, Omere Marti [phone]([id number]) on 06/03/2017 2:58:34 PM       Radiology Ct Head Code Stroke Wo Contrast  Result Date: 06/03/2017 CLINICAL DATA:  Code stroke.  Left lip numbness. EXAM: CT HEAD WITHOUT CONTRAST TECHNIQUE: Contiguous axial images were obtained from the base of the skull through the vertex without intravenous contrast. COMPARISON:  None. FINDINGS: Brain: Normal appearance without evidence of malformation, atrophy, old or acute infarction, mass lesion, hemorrhage, hydrocephalus or extra-axial collection. Vascular: There is atherosclerotic calcification of the major vessels at the base of the brain. Skull: Negative Sinuses/Orbits: Clear/normal Other: None ASPECTS (Alberta Stroke Program Early CT Score) - Ganglionic level infarction (caudate, lentiform nuclei, internal capsule, insula, M1-M3 cortex): 7 - Supraganglionic infarction (M4-M6 cortex): 3 Total score (0-10 with 10 being normal): 10 IMPRESSION: 1. Negative head CT. Normal appearance of the brain. Mild atherosclerotic calcification of the major vessels at the base of the brain. 2. ASPECTS is  10. 3. These results were called by telephone at the time of interpretation on 06/03/2017 at 1:36 pm to Dr. Bethann BerkshireJOSEPH Melek Pownall , who verbally acknowledged these results. Electronically Signed   By: Paulina FusiMark  Shogry M.D.   On: 06/03/2017 13:37    Procedures Procedures (including critical care time)  Medications Ordered in ED Medications - No data to display   Initial Impression / Assessment and Plan / ED Course  I have reviewed the triage vital signs and the nursing notes.  Pertinent labs & imaging results that were available during my care of the patient were reviewed by me and considered in my medical decision making (see chart for details).     Labs unremarkable CT scan of the head unremarkable.  Patient was seen by neurology who felt like he needed to be admitted for TIA workup.  Medicine will admit  Final Clinical Impressions(s) / ED Diagnoses   Final diagnoses:  TIA (transient ischemic attack)    ED Discharge Orders    None       Bethann BerkshireZammit, Pami Wool, MD 06/03/17 1531

## 2017-06-04 ENCOUNTER — Observation Stay (HOSPITAL_COMMUNITY): Payer: Self-pay

## 2017-06-04 ENCOUNTER — Observation Stay (HOSPITAL_BASED_OUTPATIENT_CLINIC_OR_DEPARTMENT_OTHER): Payer: Self-pay

## 2017-06-04 ENCOUNTER — Other Ambulatory Visit: Payer: Self-pay

## 2017-06-04 ENCOUNTER — Encounter (HOSPITAL_COMMUNITY): Payer: Self-pay | Admitting: Family Medicine

## 2017-06-04 DIAGNOSIS — R030 Elevated blood-pressure reading, without diagnosis of hypertension: Secondary | ICD-10-CM

## 2017-06-04 DIAGNOSIS — R739 Hyperglycemia, unspecified: Secondary | ICD-10-CM

## 2017-06-04 DIAGNOSIS — E785 Hyperlipidemia, unspecified: Secondary | ICD-10-CM

## 2017-06-04 LAB — LIPID PANEL
CHOL/HDL RATIO: 7.6 ratio
Cholesterol: 191 mg/dL (ref 0–200)
HDL: 25 mg/dL — ABNORMAL LOW (ref >40)
LDL CALC: 88 mg/dL (ref 0–99)
TRIGLYCERIDES: 391 mg/dL — AB (ref <150)
VLDL: 78 mg/dL — ABNORMAL HIGH (ref 0–40)

## 2017-06-04 LAB — ECHOCARDIOGRAM COMPLETE
AVLVOTPG: 7 mmHg
E/e' ratio: 9.52
EWDT: 257 ms
FS: 46 % — AB (ref 28–44)
IVS/LV PW RATIO, ED: 0.98
LA diam end sys: 31 mm
LA diam index: 1.44 cm/m2
LA vol A4C: 46.9 ml
LA vol index: 25.3 mL/m2
LASIZE: 31 mm
LAVOL: 54.3 mL
LDCA: 3.14 cm2
LV E/e' medial: 9.52
LV E/e'average: 9.52
LV PW d: 11.8 mm — AB (ref 0.6–1.1)
LV dias vol index: 41 mL/m2
LV e' LATERAL: 9.79 cm/s
LVDIAVOL: 89 mL (ref 62–150)
LVOT SV: 78 mL
LVOT VTI: 24.7 cm
LVOT peak vel: 134 cm/s
LVOTD: 20 mm
LVSYSVOL: 40 mL (ref 21–61)
LVSYSVOLIN: 18 mL/m2
MV Dec: 257
MV Peak grad: 3 mmHg
MV pk E vel: 93.2 m/s
MVPKAVEL: 82.3 m/s
RV LATERAL S' VELOCITY: 13.2 cm/s
RV sys press: 24 mmHg
Reg peak vel: 228 cm/s
Simpson's disk: 56
Stroke v: 50 ml
TAPSE: 20.3 mm
TDI e' lateral: 9.79
TDI e' medial: 8.49
TR max vel: 228 cm/s

## 2017-06-04 LAB — BASIC METABOLIC PANEL
Anion gap: 9 (ref 5–15)
BUN: 12 mg/dL (ref 6–20)
CO2: 22 mmol/L (ref 22–32)
CREATININE: 0.93 mg/dL (ref 0.61–1.24)
Calcium: 8.5 mg/dL — ABNORMAL LOW (ref 8.9–10.3)
Chloride: 108 mmol/L (ref 101–111)
GFR calc Af Amer: >60 mL/min (ref >60)
Glucose, Bld: 121 mg/dL — ABNORMAL HIGH (ref 65–99)
Potassium: 3.9 mmol/L (ref 3.5–5.1)
SODIUM: 139 mmol/L (ref 135–145)

## 2017-06-04 LAB — HEMOGLOBIN A1C
HEMOGLOBIN A1C: 5.6 % (ref 4.8–5.6)
Mean Plasma Glucose: 114.02 mg/dL

## 2017-06-04 LAB — GLUCOSE, CAPILLARY
GLUCOSE-CAPILLARY: 99 mg/dL (ref 65–99)
Glucose-Capillary: 139 mg/dL — ABNORMAL HIGH (ref 65–99)
Glucose-Capillary: 103 mg/dL — ABNORMAL HIGH (ref 65–99)

## 2017-06-04 MED ORDER — ASPIRIN EC 81 MG PO TBEC
81.0000 mg | DELAYED_RELEASE_TABLET | Freq: Every day | ORAL | Status: DC
  Administered 2017-06-04: 81 mg via ORAL
  Filled 2017-06-04: qty 1

## 2017-06-04 MED ORDER — ATORVASTATIN CALCIUM 10 MG PO TABS: 10.0000 mg | ORAL_TABLET | Freq: Every day | ORAL | 0 refills | Status: AC

## 2017-06-04 MED ORDER — NAPROXEN 250 MG PO TABS: 250.0000 mg | ORAL_TABLET | Freq: Every day | ORAL | Status: DC | PRN

## 2017-06-04 MED ORDER — LORATADINE 10 MG PO TABS
10.0000 mg | ORAL_TABLET | Freq: Every day | ORAL | Status: DC
  Administered 2017-06-04: 10 mg via ORAL
  Filled 2017-06-04: qty 1

## 2017-06-04 MED ORDER — ASPIRIN 81 MG PO TBEC: 81.0000 mg | DELAYED_RELEASE_TABLET | Freq: Every day | ORAL | Status: AC

## 2017-06-04 MED ORDER — ATORVASTATIN CALCIUM 10 MG PO TABS: 10.0000 mg | ORAL_TABLET | Freq: Every day | ORAL | Status: DC

## 2017-06-04 MED ORDER — FLUTICASONE PROPIONATE 50 MCG/ACT NA SUSP
2.0000 | Freq: Every day | NASAL | Status: DC
  Administered 2017-06-04: 2 via NASAL
  Filled 2017-06-04: qty 16

## 2017-06-04 MED ORDER — ENOXAPARIN SODIUM 60 MG/0.6ML ~~LOC~~ SOLN: 50.0000 mg | SUBCUTANEOUS | Status: DC

## 2017-06-04 MED ORDER — STROKE: EARLY STAGES OF RECOVERY BOOK
Freq: Once | Status: AC
  Administered 2017-06-04: 12:00:00
  Filled 2017-06-04: qty 1

## 2017-06-04 NOTE — Progress Notes (Addendum)
PROGRESS NOTE    Terry Hardin  GNF:[id number]  DOB: Jan 30, 1961  DOA: 06/03/2017 PCP: [facility]   Brief Admission Hx: The patient is a 57 year old white male who presents with the acute onset of focal neurological symptoms on Monday of this week.  MDM/Assessment & Plan:   1. Focal neurological symptoms of unclear etiology - Pt has been seen by neurology and aspirin 81 mg daily has been recommended.  EEG has been ordered and pending.  He will need to follow up with neurology outpatient in 4 weeks.  He has not been started on antiepileptics at this time.  Ongoing stroke work up negative so far, echocardiogram pending.  MRI negative for acute findings.   2. Hyperglycemia - BS have improved, A1C 5.6%.  3. Elevated blood pressure - suspect this was a stress reaction. BPs have improved and normalized so far.  4. Suspected OSA - He will need outpatient sleep study.  5. Allergic Rhinitis with sinus symptoms - will start flonase and claritin.    DVT prophylaxis: lovenox Code Status: FULL Family Communication: wife bedside Disposition Plan: home tomorrow   Consultants:  neurology  Procedures:  Echocardiogram pending  Carotid dopplers  Subjective: Pt says that his symptoms have resolved and he is feeling better, no recurrence of symptoms.   Objective: Vitals:   06/03/17 1743 06/04/17 0610 06/04/17 0800 06/04/17 1438  BP: (!) 149/84 130/66 119/78   Pulse: (!) 57     Resp: 18 18 18    Temp: 97.6 F (36.4 C) 97.6 F (36.4 C) 98.3 F (36.8 C)   TempSrc: Oral Oral Oral   SpO2: 100% 99% 97%   Weight:    107.3 kg (236 lb 8.9 oz)  Height:    5\' 11"  (1.803 m)    Intake/Output Summary (Last 24 hours) at 06/04/2017 1535 Last data filed at 06/04/2017 0400 Gross per 24 hour  Intake 480 ml  Output 400 ml  Net 80 ml   Filed Weights   06/04/17 1438  Weight: 107.3 kg (236 lb 8.9 oz)     REVIEW OF SYSTEMS  As per history otherwise all reviewed and  reported negative  Exam:  General exam: awake, alert, NAD, cooperative.  Respiratory system: Clear. No increased work of breathing. Cardiovascular system: S1 & S2 heard, RRR. No JVD, murmurs, gallops, clicks or pedal edema. Gastrointestinal system: Abdomen is nondistended, soft and nontender. Normal bowel sounds heard. Central nervous system: Alert and oriented. No focal neurological deficits. Extremities: no CCE.  Data Reviewed: Basic Metabolic Panel: Recent Labs  Lab 06/03/17 1327 06/03/17 1348 06/04/17 0549  NA 139 140 139  K 3.9 3.7 3.9  CL 102 101 108  CO2 27  --  22  GLUCOSE 151* 144* 121*  BUN 12 12 12   CREATININE 1.03 0.90 0.93  CALCIUM 9.3  --  8.5*   Liver Function Tests: Recent Labs  Lab 06/03/17 1327  AST 28  ALT 30  ALKPHOS 80  BILITOT 0.4  PROT 7.6  ALBUMIN 4.2   No results for input(s): LIPASE, AMYLASE in the last 168 hours. No results for input(s): AMMONIA in the last 168 hours. CBC: Recent Labs  Lab 06/03/17 1327 06/03/17 1348  WBC 9.3  --   NEUTROABS 6.2  --   HGB 16.8 17.7*  HCT 49.9 52.0  MCV 93.8  --   PLT 291  --    Cardiac Enzymes: No results for input(s): CKTOTAL, CKMB, CKMBINDEX, TROPONINI in the last 168 hours.  CBG (last 3)  Recent Labs    06/04/17 0738 06/04/17 1135  GLUCAP 99 139*   No results found for this or any previous visit (from the past 240 hour(s)).   Studies: Mr Brain Wo Contrast  Result Date: 06/04/2017 CLINICAL DATA:  Left-sided facial numbness for 1 day. EXAM: MRI HEAD WITHOUT CONTRAST TECHNIQUE: Multiplanar, multiecho pulse sequences of the brain and surrounding structures were obtained without intravenous contrast. COMPARISON:  Head CT from yesterday FINDINGS: Brain: No acute infarction, hemorrhage, hydrocephalus, extra-axial collection or mass lesion. Normal brain volume. No findings along the left trigeminal nerve. No demyelinating pattern. Vascular: Major flow voids are preserved. Skull and upper cervical  spine: Normal marrow signal. Sinuses/Orbits: Mild mucosal thickening in the paranasal sinuses. No acute sinusitis to explain symptoms. Anterior nasal septum perforation. IMPRESSION: 1. No acute finding or explanation for symptoms. 2. Mild generalized mucosal thickening in the paranasal sinuses. Anterior nasal septum perforation. Electronically Signed   By: Marnee Spring M.D.   On: 06/04/2017 08:52   US Carotid Bilateral (at [facility] And [facility] Only)  Result Date: 06/04/2017 CLINICAL DATA:  Stroke.  Hypertension, previous tobacco abuse. EXAM: BILATERAL CAROTID DUPLEX ULTRASOUND TECHNIQUE: Wallace Cullens scale imaging, color Doppler and duplex ultrasound was performed of bilateral carotid and vertebral arteries in the neck. COMPARISON:  None. TECHNIQUE: Quantification of carotid stenosis is based on velocity parameters that correlate the residual internal carotid diameter with NASCET-based stenosis levels, using the diameter of the distal internal carotid lumen as the denominator for stenosis measurement. The following velocity measurements were obtained: PEAK SYSTOLIC/END DIASTOLIC RIGHT ICA:                     76/19cm/sec CCA:                     97/18cm/sec SYSTOLIC ICA/CCA RATIO:  0.8 DIASTOLIC ICA/CCA RATIO: 1.1 ECA:                     87cm/sec LEFT ICA:                     65/26cm/sec CCA:                     86/17cm/sec SYSTOLIC ICA/CCA RATIO:  0.8 DIASTOLIC ICA/CCA RATIO: 1.5 ECA:                     71cm/sec FINDINGS: RIGHT CAROTID ARTERY: Minimal eccentric partially calcified plaque at the ICA origin. Normal waveforms and color Doppler signal. No significant stenosis. RIGHT VERTEBRAL ARTERY:  Normal flow direction and waveform. LEFT CAROTID ARTERY: Mild eccentric smooth plaque in the carotid bulb. No high-grade stenosis. Normal waveforms and color Doppler signal. LEFT VERTEBRAL ARTERY: Normal flow direction and waveform. IMPRESSION: 1. 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 2.  Antegrade bilateral vertebral arterial flow. Electronically Signed   By: Corlis Leak M.D.   On: 06/04/2017 09:38   Ct Head Code Stroke Wo Contrast  Result Date: 06/03/2017 CLINICAL DATA:  Code stroke.  Left lip numbness. EXAM: CT HEAD WITHOUT CONTRAST TECHNIQUE: Contiguous axial images were obtained from the base of the skull through the vertex without intravenous contrast. COMPARISON:  None. FINDINGS: Brain: Normal appearance without evidence of malformation, atrophy, old or acute infarction, mass lesion, hemorrhage, hydrocephalus or extra-axial collection. Vascular: There is atherosclerotic calcification of the major vessels at the base of the brain. Skull:  Negative Sinuses/Orbits: Clear/normal Other: None ASPECTS (Alberta Stroke Program Early CT Score) - Ganglionic level infarction (caudate, lentiform nuclei, internal capsule, insula, M1-M3 cortex): 7 - Supraganglionic infarction (M4-M6 cortex): 3 Total score (0-10 with 10 being normal): 10 IMPRESSION: 1. Negative head CT. Normal appearance of the brain. Mild atherosclerotic calcification of the major vessels at the base of the brain. 2. ASPECTS is 10. 3. These results were called by telephone at the time of interpretation on 06/03/2017 at 1:36 pm to Dr. Bethann BerkshireJOSEPH ZAMMIT , who verbally acknowledged these results. Electronically Signed   By: Paulina FusiMark  Shogry M.D.   On: 06/03/2017 13:37   Scheduled Meds: . aspirin EC  81 mg Oral Daily  . enoxaparin (LOVENOX) injection  50 mg Subcutaneous Q24H  . insulin aspart  0-9 Units Subcutaneous TID WC  . multivitamin with minerals  1 tablet Oral Daily  . sodium chloride flush  3 mL Intravenous Q12H  . vitamin B-12  100 mcg Oral Daily   Continuous Infusions:  Principal Problem:   Stroke (cerebrum) (HCC) Active Problems:   Hyperglycemia   Elevated blood pressure reading without diagnosis of hypertension   Time spent:   Standley Dakinslanford Johnson, MD, FAAFP [facility] Pager [phone]  If  7PM-7AM, please contact night-coverage [email or web address] Password [email or web address] 06/04/2017, 3:35 PM    LOS: 0 days

## 2017-06-04 NOTE — Discharge Summary (Signed)
Physician Discharge Summary  Terry Hardin ZOX:[id number] DOB: [birth year] DOA: 06/03/2017  PCP: [facility]  Admit date: 06/03/2017 Discharge date: 06/04/2017  Admitted From: HOME  Disposition: HOME  Recommendations for Outpatient Follow-up:  1. Follow up with PCP in 1 weeks 2. Follow up with neurologist in 1 month 3. Arrange outpatient EEG study.  4. Please arrange outpatient sleep study to rule out OSA. 5. Please obtain BMP/CBC in one week 6. Please follow up on the following pending results: final culture data  Discharge Condition: STABLE   CODE STATUS: FULL    Brief Hospitalization Summary: Please see all hospital notes, images, labs for full details of the hospitalization. From HPI  HPI: Terry Hardin is a 57 y.o. 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  He states he had associated symptoms of feeling like cloudy in his head.  The patient is a very stoic gentleman and never goes to see the doctor.  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  Because of the persistent cloudy feeling in his head the patient decided to come into the emergency department for further evaluation and management and a code stroke was called.  She was seen in consultation by Dr. Zachery Conch from telemetry neurology felt that the patient was having a TIA possible stroke.  Recommended further evaluation and management. Patient was not a candidate for TPA due to an NIH stroke score 0 which was 0.  Lobectomy was not recommended because his presentation was not suggestive of a large vessel occlusive  disease.  Neurology recommended observation in the hospital daily antithrombotics to be started and consultation with neurology.  Patient is therefore going to be placed in overnight observation for further evaluation and management.  Upon my evaluation patient stated he is back to normal.  Brief Admission Hx: The patient is a 57 year old white male who presents with the acute onset of focal neurological symptoms on Monday of this week.  MDM/Assessment & Plan:   1. Focal neurological symptoms of unclear etiology - Pt has been seen by neurology and aspirin 81 mg daily has been recommended.  EEG has been ordered and pending.  He will need to follow up with neurology outpatient in 4 weeks.  He has not been started on antiepileptics at this time.  Ongoing stroke work up negative so far, echocardiogram pending.  MRI negative for acute findings. 2. Hyperlipidemia - Increased LDL noted at 88, start atorvastatin, recommended starting Fish oil for high triglycerides and follow up with PCP for retesting and med titration.  Follow up with neurologist.    3. Hyperglycemia - BS have improved, A1C 5.6%.  4. Elevated blood pressure - suspect this was a stress reaction. BPs have improved and normalized so far.  5. Suspected OSA - He will need outpatient sleep study.  6. Allergic Rhinitis with sinus symptoms - recommended over the counter flonase and claritin.    DVT prophylaxis: lovenox Code Status: FULL Family Communication: wife bedside Disposition Plan: home   Consultants:  neurology  Procedures:  Echocardiogram   Carotid dopplers  Echocardiogram Study Conclusions  - Left ventricle:  The cavity size was normal. Wall thickness was   increased in a pattern of mild LVH. Systolic function was normal.   The estimated ejection fraction was in the range of 60% to 65%.   Wall motion was normal; there were no regional wall motion   abnormalities. Left ventricular diastolic function  parameters   were normal. - Atrial septum: No defect or patent foramen ovale was identified.  Carotid Dopplers IMPRESSION: 1. Early bilateral carotid bifurcation plaque resulting in less than 50% diameter stenosis. 2.  Antegrade bilateral vertebral arterial flow.   Discharge Diagnoses:  Principal Problem:   Stroke (cerebrum) (HCC) Active Problems:   Hyperglycemia   Elevated blood pressure reading without diagnosis of hypertension  Discharge Instructions: Discharge Instructions    Call MD for:  extreme fatigue   Complete by:  As directed    Call MD for:  persistant dizziness or light-headedness   Complete by:  As directed    Call MD for:  persistant nausea and vomiting   Complete by:  As directed    Increase activity slowly   Complete by:  As directed      Allergies as of 06/04/2017   No Known Allergies     Medication List    TAKE these medications   aspirin 81 MG EC tablet Take 1 tablet (81 mg total) by mouth daily. Start taking on:  06/05/2017   atorvastatin 10 MG tablet Commonly known as:  LIPITOR Take 1 tablet (10 mg total) by mouth daily at 6 PM.   multivitamin with minerals Tabs tablet Take 1 tablet by mouth daily.   naproxen sodium 220 MG tablet Commonly known as:  ALEVE Take 220 mg by mouth daily as needed.   sodium chloride 0.65 % Soln nasal spray Commonly known as:  OCEAN Place 1 spray into both nostrils as needed for congestion.   vitamin B-12 100 MCG tablet Commonly known as:  CYANOCOBALAMIN Take 100 mcg by mouth daily.      Follow-up Information    Beryle Beamsoonquah, Kofi, MD. Schedule an appointment as soon as possible for a visit in 1 month(s).   Specialty:  Neurology Why:  Hospital Follow Up and arrange EEG Contact information: [address] [place] [id number] [phone]        [facility], [facility]. Schedule an appointment as soon as possible for a visit in 1 week(s).   Specialty:  Family Medicine Why:  Hospital  Follow Up  Contact information: [address] [place] [id number] [phone]          No Known Allergies Allergies as of 06/04/2017   No Known Allergies     Medication List    TAKE these medications   aspirin 81 MG EC tablet Take 1 tablet (81 mg total) by mouth daily. Start taking on:  06/05/2017   atorvastatin 10 MG tablet Commonly known as:  LIPITOR Take 1 tablet (10 mg total) by mouth daily at 6 PM.   multivitamin with minerals Tabs tablet Take 1 tablet by mouth daily.   naproxen sodium 220 MG tablet Commonly known as:  ALEVE Take 220 mg by mouth daily as needed.   sodium chloride 0.65 % Soln nasal spray Commonly known as:  OCEAN Place 1 spray into both nostrils as needed for congestion.   vitamin B-12 100 MCG tablet Commonly known as:  CYANOCOBALAMIN Take 100 mcg by mouth daily.       Procedures/Studies: Mr Brain Wo Contrast  Result Date:  06/04/2017 CLINICAL DATA:  Left-sided facial numbness for 1 day. EXAM: MRI HEAD WITHOUT CONTRAST TECHNIQUE: Multiplanar, multiecho pulse sequences of the brain and surrounding structures were obtained without intravenous contrast. COMPARISON:  Head CT from yesterday FINDINGS: Brain: No acute infarction, hemorrhage, hydrocephalus, extra-axial collection or mass lesion. Normal brain volume. No findings along the left trigeminal nerve. No demyelinating pattern. Vascular: Major flow voids are preserved. Skull and upper cervical spine: Normal marrow signal. Sinuses/Orbits: Mild mucosal thickening in the paranasal sinuses. No acute sinusitis to explain symptoms. Anterior nasal septum perforation. IMPRESSION: 1. No acute finding or explanation for symptoms. 2. Mild generalized mucosal thickening in the paranasal sinuses. Anterior nasal septum perforation. Electronically Signed   By: Marnee Spring M.D.   On: 06/04/2017 08:52   US Carotid Bilateral (at [facility] And [facility] Only)  Result Date: 06/04/2017 CLINICAL DATA:  Stroke.   Hypertension, previous tobacco abuse. EXAM: BILATERAL CAROTID DUPLEX ULTRASOUND TECHNIQUE: Wallace Cullens scale imaging, color Doppler and duplex ultrasound was performed of bilateral carotid and vertebral arteries in the neck. COMPARISON:  None. TECHNIQUE: Quantification of carotid stenosis is based on velocity parameters that correlate the residual internal carotid diameter with NASCET-based stenosis levels, using the diameter of the distal internal carotid lumen as the denominator for stenosis measurement. The following velocity measurements were obtained: PEAK SYSTOLIC/END DIASTOLIC RIGHT ICA:                     76/19cm/sec CCA:                     97/18cm/sec SYSTOLIC ICA/CCA RATIO:  0.8 DIASTOLIC ICA/CCA RATIO: 1.1 ECA:                     87cm/sec LEFT ICA:                     65/26cm/sec CCA:                     86/17cm/sec SYSTOLIC ICA/CCA RATIO:  0.8 DIASTOLIC ICA/CCA RATIO: 1.5 ECA:                     71cm/sec FINDINGS: RIGHT CAROTID ARTERY: Minimal eccentric partially calcified plaque at the ICA origin. Normal waveforms and color Doppler signal. No significant stenosis. RIGHT VERTEBRAL ARTERY:  Normal flow direction and waveform. LEFT CAROTID ARTERY: Mild eccentric smooth plaque in the carotid bulb. No high-grade stenosis. Normal waveforms and color Doppler signal. LEFT VERTEBRAL ARTERY: Normal flow direction and waveform. IMPRESSION: 1. Early bilateral carotid bifurcation plaque resulting in less than 50% diameter stenosis. 2.  Antegrade bilateral vertebral arterial flow. Electronically Signed   By: Corlis Leak M.D.   On: 06/04/2017 09:38   Ct Head Code Stroke Wo Contrast  Result Date: 06/03/2017 CLINICAL DATA:  Code stroke.  Left lip numbness. EXAM: CT HEAD WITHOUT CONTRAST TECHNIQUE: Contiguous axial images were obtained from the base of the skull through the vertex without intravenous contrast. COMPARISON:  None. FINDINGS: Brain: Normal appearance without evidence of malformation, atrophy, old or acute  infarction, mass lesion, hemorrhage, hydrocephalus or extra-axial collection. Vascular: There is atherosclerotic calcification of the major vessels at the base of the brain. Skull: Negative Sinuses/Orbits: Clear/normal Other: None ASPECTS (Alberta Stroke Program Early CT Score) - Ganglionic level infarction (caudate, lentiform nuclei, internal capsule, insula, M1-M3 cortex): 7 - Supraganglionic infarction (M4-M6 cortex): 3 Total score (0-10 with 10 being normal): 10 IMPRESSION: 1. Negative head  CT. Normal appearance of the brain. Mild atherosclerotic calcification of the major vessels at the base of the brain. 2. ASPECTS is 10. 3. These results were called by telephone at the time of interpretation on 06/03/2017 at 1:36 pm to Dr. Bethann Berkshire , who verbally acknowledged these results. Electronically Signed   By: Paulina Fusi M.D.   On: 06/03/2017 13:37      Subjective: Pt says that he feels better and he really wants to go home.  He says that will make every effort to follow up as directed.    Discharge Exam: Vitals:   06/04/17 0800 06/04/17 1200  BP: 119/78 126/74  Pulse:    Resp: 18 20  Temp: 98.3 F (36.8 C) 98.1 F (36.7 C)  SpO2: 97% 98%   Vitals:   06/04/17 0610 06/04/17 0800 06/04/17 1200 06/04/17 1438  BP: 130/66 119/78 126/74   Pulse:      Resp: 18 18 20    Temp: 97.6 F (36.4 C) 98.3 F (36.8 C) 98.1 F (36.7 C)   TempSrc: Oral Oral Oral   SpO2: 99% 97% 98%   Weight:    107.3 kg (236 lb 8.9 oz)  Height:    5\' 11"  (1.803 m)    General: Pt is alert, awake, not in acute distress Cardiovascular: RRR, S1/S2 +, no rubs, no gallops Respiratory: CTA bilaterally, no wheezing, no rhonchi Abdominal: Soft, NT, ND, bowel sounds + Extremities: no edema, no cyanosis Neurological: no focal findings.    The results of significant diagnostics from this hospitalization (including imaging, microbiology, ancillary and laboratory) are listed below for reference.     Microbiology: No  results found for this or any previous visit (from the past 240 hour(s)).   Labs: BNP (last 3 results) No results for input(s): BNP in the last 8760 hours. Basic Metabolic Panel: Recent Labs  Lab 06/03/17 1327 06/03/17 1348 06/04/17 0549  NA 139 140 139  K 3.9 3.7 3.9  CL 102 101 108  CO2 27  --  22  GLUCOSE 151* 144* 121*  BUN 12 12 12   CREATININE 1.03 0.90 0.93  CALCIUM 9.3  --  8.5*   Liver Function Tests: Recent Labs  Lab 06/03/17 1327  AST 28  ALT 30  ALKPHOS 80  BILITOT 0.4  PROT 7.6  ALBUMIN 4.2   No results for input(s): LIPASE, AMYLASE in the last 168 hours. No results for input(s): AMMONIA in the last 168 hours. CBC: Recent Labs  Lab 06/03/17 1327 06/03/17 1348  WBC 9.3  --   NEUTROABS 6.2  --   HGB 16.8 17.7*  HCT 49.9 52.0  MCV 93.8  --   PLT 291  --    Cardiac Enzymes: No results for input(s): CKTOTAL, CKMB, CKMBINDEX, TROPONINI in the last 168 hours. BNP: Invalid input(s): POCBNP CBG: Recent Labs  Lab 06/04/17 0738 06/04/17 1135 06/04/17 1634  GLUCAP 99 139* 103*   D-Dimer No results for input(s): DDIMER in the last 72 hours. Hgb A1c Recent Labs    06/03/17 1327  HGBA1C 5.6   Lipid Profile Recent Labs    06/04/17 0549  CHOL 191  HDL 25*  LDLCALC 88  TRIG 960*  CHOLHDL 7.6   Thyroid function studies No results for input(s): TSH, T4TOTAL, T3FREE, THYROIDAB in the last 72 hours.  Invalid input(s): FREET3 Anemia work up No results for input(s): VITAMINB12, FOLATE, FERRITIN, TIBC, IRON, RETICCTPCT in the last 72 hours. Urinalysis    Component Value Date/Time  COLORURINE YELLOW 06/03/2017 1330   APPEARANCEUR CLEAR 06/03/2017 1330   LABSPEC 1.009 06/03/2017 1330   PHURINE 5.0 06/03/2017 1330   GLUCOSEU NEGATIVE 06/03/2017 1330   HGBUR NEGATIVE 06/03/2017 1330   BILIRUBINUR NEGATIVE 06/03/2017 1330   KETONESUR NEGATIVE 06/03/2017 1330   PROTEINUR NEGATIVE 06/03/2017 1330   UROBILINOGEN 0.2 08/17/2014 0918   NITRITE  NEGATIVE 06/03/2017 1330   LEUKOCYTESUR NEGATIVE 06/03/2017 1330   Sepsis Labs Invalid input(s): PROCALCITONIN,  WBC,  LACTICIDVEN Microbiology No results found for this or any previous visit (from the past 240 hour(s)).  Time coordinating discharge:   SIGNED:  Standley Dakins, MD  [facility] 06/04/2017, 5:42 PM Pager [phone]  If 7PM-7AM, please contact night-coverage [email or web address] Password [email or web address]

## 2017-06-04 NOTE — Evaluation (Signed)
Physical Therapy Evaluation Patient Details Name: Terry Hardin MRN: [id number] DOB: 01-06-1961 Today's Date: 06/04/2017   History of Present Illness  Terry Hardin is a 57 y.o. 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  He states he had associated symptoms of feeling like cloudy in his head.  The patient is a very stoic gentleman and never goes to see the doctor.  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  Because of the persistent cloudy feeling in his head the patient decided to come into the emergency department for further evaluation and management and a code stroke was called.  She was seen in consultation by Dr. Zachery ConchFriedman from telemetry neurology felt that the patient was having a TIA possible stroke.  Recommended further evaluation and management.    Clinical Impression  Patient functioning at baseline for functional mobility/gait demonstrates no loss of balance on level floors or going up/down stairs without use of side rails.  Plan:  Patient discharged from physical therapy to care of nursing for ambulation ad lib for length of stay.    Follow Up Recommendations No PT follow up    Equipment Recommendations  None recommended by PT    Recommendations for Other Services       Precautions / Restrictions Precautions Precautions: None Restrictions Weight Bearing Restrictions: No      Mobility  Bed Mobility Overal bed mobility: Independent                Transfers Overall transfer level: Independent                  Ambulation/Gait Ambulation/Gait assistance:  Independent Ambulation Distance (Feet): 150 Feet Assistive device: None Gait Pattern/deviations: WFL(Within Functional Limits)        Stairs Stairs: Yes Stairs assistance: Independent Stair Management: No rails;Alternating pattern Number of Stairs: 9 General stair comments: Patient demonstrates good return for going up/down 9 steps without use of siderails and no loss of balance  Wheelchair Mobility    Modified Rankin (Stroke Patients Only)       Balance Overall balance assessment: No apparent balance deficits (not formally assessed)                                           Pertinent Vitals/Pain Pain Assessment: No/denies pain    Home Living Family/patient expects to be discharged to:: Private residence Living Arrangements: Spouse/significant other Available Help at Discharge: Family;Available PRN/intermittently Type of Home: House Home Access: Stairs to enter Entrance Stairs-Rails: None Entrance Stairs-Number of Steps: 2 Home Layout: Two level Home Equipment: None      Prior Function Level of Independence: Independent               Hand Dominance   Dominant Hand: Right    Extremity/Trunk Assessment   Upper Extremity Assessment Upper Extremity Assessment: Defer to OT evaluation    Lower Extremity Assessment Lower Extremity Assessment: Overall WFL for tasks assessed       Communication   Communication: No  difficulties  Cognition Arousal/Alertness: Awake/alert Behavior During Therapy: WFL for tasks assessed/performed Overall Cognitive Status: Within Functional Limits for tasks assessed                                        General Comments      Exercises     Assessment/Plan    PT Assessment Patent does not need any further PT services  PT Problem List         PT Treatment Interventions      PT Goals (Current goals can be found in the Care Plan section)  Acute Rehab PT Goals Patient Stated  Goal: return home PT Goal Formulation: With patient/family Time For Goal Achievement: 06/17/2017 Potential to Achieve Goals: Good    Frequency     Barriers to discharge        Co-evaluation               AM-PAC PT "6 Clicks" Daily Activity  Outcome Measure Difficulty turning over in bed (including adjusting bedclothes, sheets and blankets)?: None Difficulty moving from lying on back to sitting on the side of the bed? : None Difficulty sitting down on and standing up from a chair with arms (e.g., wheelchair, bedside commode, etc,.)?: None Help needed moving to and from a bed to chair (including a wheelchair)?: None Help needed walking in hospital room?: None Help needed climbing 3-5 steps with a railing? : None 6 Click Score: 24    End of Session   Activity Tolerance: Patient tolerated treatment well Patient left: in bed;with call bell/phone within reach;with family/visitor present Nurse Communication: Mobility status PT Visit Diagnosis: Unsteadiness on feet (R26.81);Other abnormalities of gait and mobility (R26.89);Muscle weakness (generalized) (M62.81)    Time: 1610-9604 PT Time Calculation (min) (ACUTE ONLY): 13 min   Charges:   PT Evaluation $PT Eval Low Complexity: 1 Low PT Treatments $Gait Training: 8-22 mins   PT G Codes:        1:25 PM, June 17, 2017 Ocie Bob, MPT Physical Therapist with [facility] [phone] office [phone] mobile phone

## 2017-06-04 NOTE — Consult Note (Signed)
Alice A. Merlene Laughter, MD     [email or web address]          Terry Hardin is an 57 y.o. male.   ASSESSMENT/PLAN: 1. Focal neurological symptoms of unclear etiology. The semiology suggests possible complex partial seizures. TIA is also a possibility but I think this is less likely. Low-dose aspirin 81 mg recommended. The EEG is also recommended. This potentially could be done as an outpatient. The patient should follow-up in the office with [place] in 4 weeks.      The patient is a 57 year old white male who presents with the acute onset of focal neurological symptoms on Monday of this week. He tells me that he just did not feel right and had tingling involving the left fingers. He reports having these spells repeatedly throughout the day on Monday. He denies clear numbness however. He denies heaviness. He denies involvement of the left leg or left facial region. He denies any dysarthria or dysphasia. He denies chest pain. He did have some mild dyspnea but tells me he has this on the baseline basis. Though the episodes seemed to progress with the patient just not feeling right in his head and having difficulties focusing and concentrating followed by tingling of the left upper extremity. No loss of consciousness is reported. No clear tonic-clonic activities. The patient does admit to having significant psychosocial stresses at work and that he is working long hours.  review systems otherwise negative.   GENERAL: Hhe is doing well.  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    BACK: This is normal.  SKIN: Normal by inspection.    MENTAL STATUS: Alert and oriented -mostly of though he did get his age stated that 50. Monthly stated appropriately and year.  Speech, language and cognition are generally intact. Judgment and insight normal.   CRANIAL NERVES: Pupils are equal, round and reactive to light and  accomodation; extra ocular movements are full, there is no significant nystagmus; visual fields are full; upper and lower facial muscles are normal in strength and symmetric, there is no flattening of the nasolabial folds; tongue is midline; uvula is midline; shoulder elevation is normal.  MOTOR: Normal tone, bulk and strength; no pronator drift. There is no drift of the upper lower extremities.  COORDINATION: Left finger to nose is normal, right finger to nose is normal, No rest tremor; no intention tremor; no postural tremor; no bradykinesia.  REFLEXES: Deep tendon reflexes are symmetrical and normal.    SENSATION: Normal to light touch, temperature, and pain. There is no extinction on double simultaneous stimulation.   NIH stroke scale 1.      Blood pressure 130/66, pulse (!) 57, temperature 97.6 F (36.4 C), temperature source Oral, resp. rate 18, SpO2 99 %.  History reviewed. No pertinent past medical history.  Past Surgical History:  Procedure Laterality Date  . orhtopedic surgery      History reviewed. No pertinent family history.  Social History:  reports that he has quit smoking. His smoking use included cigarettes. he has never used smokeless tobacco. He reports that he drinks alcohol. He reports that he does not use drugs.  Allergies: No Known Allergies  Medications: Prior to Admission medications   Medication Sig Start Date End Date Taking? Authorizing Provider  Multiple Vitamin (MULTIVITAMIN WITH MINERALS) TABS tablet Take 1 tablet by mouth daily.   Yes [provider]  naproxen sodium (ANAPROX) 220 MG tablet  Take 220 mg by mouth daily as needed.   Yes [provider]  sodium chloride (OCEAN) 0.65 % SOLN nasal spray Place 1 spray into both nostrils as needed for congestion.   Yes [provider]  vitamin B-12 (CYANOCOBALAMIN) 100 MCG tablet Take 100 mcg by mouth daily.   Yes [provider]    Scheduled Meds: .  stroke:  mapping our early stages of recovery book   Does not apply Once  . aspirin  300 mg Rectal Daily   Or  . aspirin  325 mg Oral Daily  . enoxaparin (LOVENOX) injection  40 mg Subcutaneous Q24H  . insulin aspart  0-9 Units Subcutaneous TID WC  . multivitamin with minerals  1 tablet Oral Daily  . sodium chloride flush  3 mL Intravenous Q12H  . vitamin B-12  100 mcg Oral Daily   Continuous Infusions: PRN Meds:.acetaminophen **OR** acetaminophen, naproxen, ondansetron **OR** ondansetron (ZOFRAN) IV, sodium chloride     Results for orders placed or performed during the hospital encounter of 06/03/17 (from the past 48 hour(s))  Ethanol     Status: None   Collection Time: 06/03/17  1:27 PM  Result Value Ref Range   Alcohol, Ethyl (B) <10 <10 mg/dL    Comment:        LOWEST DETECTABLE LIMIT FOR SERUM ALCOHOL IS 10 mg/dL FOR MEDICAL PURPOSES ONLY Performed at [facility], [address], [place], [place] [address]   Protime-INR     Status: None   Collection Time: 06/03/17  1:27 PM  Result Value Ref Range   Prothrombin Time 12.6 11.4 - 15.2 seconds   INR 0.95     Comment: Performed at [facility], [address], [place], [place] [address]  APTT     Status: Abnormal   Collection Time: 06/03/17  1:27 PM  Result Value Ref Range   aPTT 39 (H) 24 - 36 seconds    Comment:        IF BASELINE aPTT IS ELEVATED, SUGGEST PATIENT RISK ASSESSMENT BE USED TO DETERMINE APPROPRIATE ANTICOAGULANT THERAPY. Performed at [facility], [address], [place], [place] [address]   CBC     Status: None   Collection Time: 06/03/17  1:27 PM  Result Value Ref Range   WBC 9.3 4.0 - 10.5 K/uL   RBC 5.32 4.22 - 5.81 MIL/uL   Hemoglobin 16.8 13.0 - 17.0 g/dL   HCT 49.9 39.0 - 52.0 %   MCV 93.8 78.0 - 100.0 fL   MCH 31.6 26.0 - 34.0 pg   MCHC 33.7 30.0 - 36.0 g/dL   RDW 12.8 11.5 - 15.5 %   Platelets 291 150 - 400 K/uL    Comment: Performed at [facility], [address] [place], [place] [address]    Differential     Status: None   Collection Time: 06/03/17  1:27 PM  Result Value Ref Range   Neutrophils Relative % 66 %   Neutro Abs 6.2 1.7 - 7.7 K/uL   Lymphocytes Relative 25 %   Lymphs Abs 2.3 0.7 - 4.0 K/uL   Monocytes Relative 6 %   Monocytes Absolute 0.5 0.1 - 1.0 K/uL   Eosinophils Relative 3 %   Eosinophils Absolute 0.3 0.0 - 0.7 K/uL   Basophils Relative 0 %   Basophils Absolute 0.0 0.0 - 0.1 K/uL    Comment: Performed at [facility] [place], [address] [place], [place] [address]  Comprehensive metabolic panel     Status:  Abnormal   Collection Time: 06/03/17  1:27 PM  Result Value Ref Range   Sodium 139 135 - 145 mmol/L   Potassium 3.9 3.5 - 5.1 mmol/L   Chloride 102 101 - 111 mmol/L   CO2 27 22 - 32 mmol/L   Glucose, Bld 151 (H) 65 - 99 mg/dL   BUN 12 6 - 20 mg/dL   Creatinine, Ser 1.03 0.61 - 1.24 mg/dL   Calcium 9.3 8.9 - 10.3 mg/dL   Total Protein 7.6 6.5 - 8.1 g/dL   Albumin 4.2 3.5 - 5.0 g/dL   AST 28 15 - 41 U/L   ALT 30 17 - 63 U/L   Alkaline Phosphatase 80 38 - 126 U/L   Total Bilirubin 0.4 0.3 - 1.2 mg/dL   GFR calc non Af Amer >60 >60 mL/min   GFR calc Af Amer >60 >60 mL/min    Comment: (NOTE) The eGFR has been calculated using the CKD EPI equation. This calculation has not been validated in all clinical situations. eGFR's persistently <60 mL/min signify possible Chronic Kidney Disease.    Anion gap 10 5 - 15    Comment: Performed at [facility], [address], [place], [place] [address]  Urine rapid drug screen (hosp performed)     Status: None   Collection Time: 06/03/17  1:30 PM  Result Value Ref Range   Opiates NONE DETECTED NONE DETECTED   Cocaine NONE DETECTED NONE DETECTED   Benzodiazepines NONE DETECTED NONE DETECTED   Amphetamines NONE DETECTED NONE DETECTED   Tetrahydrocannabinol NONE DETECTED NONE DETECTED   Barbiturates NONE DETECTED NONE DETECTED    Comment: (NOTE) DRUG SCREEN FOR MEDICAL PURPOSES ONLY.  IF CONFIRMATION IS  NEEDED FOR ANY PURPOSE, NOTIFY LAB WITHIN 5 DAYS. LOWEST DETECTABLE LIMITS FOR URINE DRUG SCREEN Drug Class                     Cutoff (ng/mL) Amphetamine and metabolites    1000 Barbiturate and metabolites    200 Benzodiazepine                 267 Tricyclics and metabolites     300 Opiates and metabolites        300 Cocaine and metabolites        300 THC                            50 Performed at [facility], [address] [place], [place] [address]   Urinalysis, Routine w reflex microscopic     Status: None   Collection Time: 06/03/17  1:30 PM  Result Value Ref Range   Color, Urine YELLOW YELLOW   APPearance CLEAR CLEAR   Specific Gravity, Urine 1.009 1.005 - 1.030   pH 5.0 5.0 - 8.0   Glucose, UA NEGATIVE NEGATIVE mg/dL   Hgb urine dipstick NEGATIVE NEGATIVE   Bilirubin Urine NEGATIVE NEGATIVE   Ketones, ur NEGATIVE NEGATIVE mg/dL   Protein, ur NEGATIVE NEGATIVE mg/dL   Nitrite NEGATIVE NEGATIVE   Leukocytes, UA NEGATIVE NEGATIVE    Comment: Performed at [facility], [address] [place],  [address]  I-stat troponin, ED     Status: None   Collection Time: 06/03/17  1:46 PM  Result Value Ref Range   Troponin i, poc 0.00 0.00 - 0.08 ng/mL   Comment 3            Comment: Due to the  release kinetics of cTnI, a negative result within the first hours of the onset of symptoms does not rule out myocardial infarction with certainty. If myocardial infarction is still suspected, repeat the test at appropriate intervals.   I-Stat Chem 8, ED     Status: Abnormal   Collection Time: 06/03/17  1:48 PM  Result Value Ref Range   Sodium 140 135 - 145 mmol/L   Potassium 3.7 3.5 - 5.1 mmol/L   Chloride 101 101 - 111 mmol/L   BUN 12 6 - 20 mg/dL   Creatinine, Ser 0.90 0.61 - 1.24 mg/dL   Glucose, Bld 144 (H) 65 - 99 mg/dL   Calcium, Ion 1.15 1.15 - 1.40 mmol/L   TCO2 29 22 - 32 mmol/L   Hemoglobin 17.7 (H) 13.0 - 17.0 g/dL   HCT 52.0 39.0 - 99.8 %  Basic metabolic  panel     Status: Abnormal   Collection Time: 06/04/17  5:49 AM  Result Value Ref Range   Sodium 139 135 - 145 mmol/L   Potassium 3.9 3.5 - 5.1 mmol/L   Chloride 108 101 - 111 mmol/L   CO2 22 22 - 32 mmol/L   Glucose, Bld 121 (H) 65 - 99 mg/dL   BUN 12 6 - 20 mg/dL   Creatinine, Ser 0.93 0.61 - 1.24 mg/dL   Calcium 8.5 (L) 8.9 - 10.3 mg/dL   GFR calc non Af Amer >60 >60 mL/min   GFR calc Af Amer >60 >60 mL/min    Comment: (NOTE) The eGFR has been calculated using the CKD EPI equation. This calculation has not been validated in all clinical situations. eGFR's persistently <60 mL/min signify possible Chronic Kidney Disease.    Anion gap 9 5 - 15    Comment: Performed at [facility], [address] [place], [place] [address]  Lipid panel     Status: Abnormal   Collection Time: 06/04/17  5:49 AM  Result Value Ref Range   Cholesterol 191 0 - 200 mg/dL   Triglycerides 391 (H) <150 mg/dL   HDL 25 (L) >40 mg/dL   Total CHOL/HDL Ratio 7.6 RATIO   VLDL 78 (H) 0 - 40 mg/dL   LDL Cholesterol 88 0 - 99 mg/dL    Comment:        Total Cholesterol/HDL:CHD Risk Coronary Heart Disease Risk Table                     Men   Women  1/2 Average Risk   3.4   3.3  Average Risk       5.0   4.4  2 X Average Risk   9.6   7.1  3 X Average Risk  23.4   11.0        Use the calculated Patient Ratio above and the CHD Risk Table to determine the patient's CHD Risk.        ATP III CLASSIFICATION (LDL):  <100     mg/dL   Optimal  100-129  mg/dL   Near or Above                    Optimal  130-159  mg/dL   Borderline  160-189  mg/dL   High  >190     mg/dL   Very High Performed at [place]., , [place] [address]   Glucose, capillary     Status: None   Collection Time: 06/04/17  7:38 AM  Result Value Ref Range   Glucose-Capillary 99 65 - 99 mg/dL   Comment 1 Notify RN    Comment 2 Document in Chart     Studies/Results:  BRAIN MRA  FINDINGS: Brain: No acute  infarction, hemorrhage, hydrocephalus, extra-axial collection or mass lesion. Normal brain volume. No findings along the left trigeminal nerve. No demyelinating pattern.  Vascular: Major flow voids are preserved.  Skull and upper cervical spine: Normal marrow signal.  Sinuses/Orbits: Mild mucosal thickening in the paranasal sinuses. No acute sinusitis to explain symptoms. Anterior nasal septum perforation.  IMPRESSION: 1. No acute finding or explanation for symptoms. 2. Mild generalized mucosal thickening in the paranasal sinuses. Anterior nasal septum perforation.     The brain MRI is reviewed in person. No abnormalities are appreciated on DWI or SWI. No white matter lesions are appreciated. The scan is essentially normal.    Byrd Rushlow A. Merlene Laughter, M.D.  Diplomate, Tax adviser of Psychiatry and Neurology ( Neurology). 06/04/2017, 9:12 AM

## 2017-06-04 NOTE — Progress Notes (Signed)
CODE STROKE 1314 CALL 1323 BEEPER 1324 EXAM STARTED 1326 EXAM FINISHED 1326 IMAGES SENT TO SOC 1328 EXAM COMPLETED IN EPIC 1328 [facility] CALLED

## 2017-06-04 NOTE — Progress Notes (Signed)
*  PRELIMINARY RESULTS* Echocardiogram 2D Echocardiogram has been performed.  Stacey DrainWhite, Shawnita Krizek J 06/04/2017, 1:29 PM

## 2017-06-04 NOTE — Evaluation (Signed)
Occupational Therapy Evaluation Patient Details Name: Terry Hardin W. Cinquemani MRN: [id number] DOB: 11-25-1960 Today's Date: 06/04/2017    History of Present Illness Terry Hardin W. [name] is a 57 y.o. 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  He states he had associated symptoms of feeling like cloudy in his head.  The patient is a very stoic gentleman and never goes to see the doctor.  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  Because of the persistent cloudy feeling in his head the patient decided to come into the emergency department for further evaluation and management and a code stroke was called.  She was seen in consultation by Dr. Zachery ConchFriedman from telemetry neurology felt that the patient was having a TIA possible stroke.  Recommended further evaluation and management.   Clinical Impression   Pt received supine in bed, agreeable to OT evaluation. Pt demonstrates LUE strength WNL, coordination and sensation are intact. Pt performing B/ADLs and functional mobility tasks independently. No further OT services required at this time.     Follow Up Recommendations  No OT follow up    Equipment Recommendations  None recommended by OT       Precautions / Restrictions Precautions Precautions: None Restrictions Weight Bearing Restrictions: No      Mobility Bed Mobility Overal bed mobility: Independent                Transfers Overall transfer level: Independent Equipment used: None                      ADL either performed or assessed with clinical judgement   ADL Overall ADL's :  Independent;At baseline                                             Vision Baseline Vision/History: No visual deficits Patient Visual Report: No change from baseline Vision Assessment?: No apparent visual deficits            Pertinent Vitals/Pain Pain Assessment: No/denies pain     Hand Dominance Right   Extremity/Trunk Assessment Upper Extremity Assessment Upper Extremity Assessment: Overall WFL for tasks assessed   Lower Extremity Assessment Lower Extremity Assessment: Defer to PT evaluation   Cervical / Trunk Assessment Cervical / Trunk Assessment: Normal   Communication Communication Communication: No difficulties   Cognition Arousal/Alertness: Awake/alert Behavior During Therapy: WFL for tasks assessed/performed Overall Cognitive Status: Within Functional Limits for tasks assessed                                                Home Living Family/patient expects to be discharged to:: Private residence Living Arrangements: Spouse/significant other Available Help at Discharge: Family;Available PRN/intermittently Type of Home: House Home Access: Stairs to enter Entergy CorporationEntrance Stairs-Number of Steps: 2 Entrance Stairs-Rails: None Home Layout: Two level Alternate Level Stairs-Number of Steps: 14  Bathroom Shower/Tub: Doctor, general practice: None          Prior Functioning/Environment Level of Independence: Independent                  AM-PAC PT "6 Clicks" Daily Activity     Outcome Measure Help from another person eating meals?: None Help from another person taking care of personal grooming?: None Help from another person toileting, which includes using toliet, bedpan, or urinal?: None Help from another person bathing (including washing, rinsing, drying)?: None Help from another person to put on and taking off regular upper body clothing?: None Help from another person to  put on and taking off regular lower body clothing?: None 6 Click Score: 24   End of Session    Activity Tolerance: Patient tolerated treatment well Patient left: in bed;with call bell/phone within reach  OT Visit Diagnosis: Muscle weakness (generalized) (M62.81)                Time: 4098-1191 OT Time Calculation (min): 12 min Charges:  OT General Charges $OT Visit: 1 Visit OT Evaluation $OT Eval Low Complexity: 1 Low    Ezra Sites, OTR/L  [phone] 06/04/2017, 7:50 AM

## 2017-06-04 NOTE — Discharge Instructions (Signed)
1. Follow up with PCP in 1 weeks 2. Follow up with neurologist in 1 month 3. Arrange outpatient EEG study.  4. Please arrange outpatient sleep study to rule out OSA. 5. Please obtain BMP/CBC in one week 6. Please follow up on the following pending results: final culture data     Please follow up with neurologist.  Please see your primary care provider for hospital follow up for 1 week.  Follow with Primary [facility], [facility]  and other consultant's as instructed your Hospitalist MD  Please get a complete blood count and chemistry panel checked by your Primary MD at your next visit, and again as instructed by your Primary MD.  Get Medicines reviewed and adjusted: Please take all your medications with you for your next visit with your Primary MD  Laboratory/radiological data: Please request your Primary MD to go over all hospital tests and procedure/radiological results at the follow up, please ask your Primary MD to get all Hospital records sent to his/her office.  In some cases, they will be blood work, cultures and biopsy results pending at the time of your discharge. Please request that your primary care M.D. follows up on these results.  Also Note the following: If you experience worsening of your admission symptoms, develop shortness of breath, life threatening emergency, suicidal or homicidal thoughts you must seek medical attention immediately by calling 911 or calling your MD immediately  if symptoms less severe.  You must read complete instructions/literature along with all the possible adverse reactions/side effects for all the Medicines you take and that have been prescribed to you. Take any new Medicines after you have completely understood and accpet all the possible adverse reactions/side effects.   Do not drive when taking Pain medications or sleeping medications (Benzodaizepines)  Do not take more than prescribed Pain, Sleep and Anxiety Medications. It  is not advisable to combine anxiety,sleep and pain medications without talking with your primary care practitioner  Special Instructions: If you have smoked or chewed Tobacco  in the last 2 yrs please stop smoking, stop any regular Alcohol  and or any Recreational drug use.  Wear Seat belts while driving.  Please note: You were cared for by a hospitalist during your hospital stay. Once you are discharged, your primary care physician will handle any further medical issues. Please note that NO REFILLS for any discharge medications will be authorized once you are discharged, as it is imperative that you return to your primary care physician (or establish a relationship with a primary care physician if you do not have one) for your post hospital discharge needs so that they can reassess your need for medications and monitor your lab values.      Aspirin and Your Heart Aspirin is a medicine that affects the way blood clots. Aspirin can be used to help reduce the risk of blood clots, heart attacks, and other heart-related problems. Should I take aspirin? Your health care provider will help you determine whether it is safe and beneficial for you to take aspirin daily. Taking aspirin daily may be beneficial if you:  Have had a heart attack or chest pain.  Have undergone open heart surgery such as coronary artery bypass surgery (CABG).  Have had coronary angioplasty.  Have experienced a stroke or transient ischemic attack (TIA).  Have peripheral vascular disease (PVD).  Have chronic heart rhythm problems such as atrial fibrillation.  Are there any risks of taking aspirin daily? Daily use of aspirin  can increase your risk of side effects. Some of these include:  Bleeding. Bleeding problems can be minor or serious. An example of a minor problem is a cut that does not stop bleeding. An example of a more serious problem is stomach bleeding or bleeding into the brain. Your risk of bleeding is  increased if you are also taking non-steroidal anti-inflammatory medicine (NSAIDs).  Increased bruising.  Upset stomach.  An allergic reaction. People who have nasal polyps have an increased risk of developing an aspirin allergy.  What are some guidelines I should follow when taking aspirin?  Take aspirin only as directed by your health care provider. Make sure you understand how much you should take and what form you should take. The two forms of aspirin are: ? Non-enteric-coated. This type of aspirin does not have a coating and is absorbed quickly. Non-enteric-coated aspirin is usually recommended for people with chest pain. This type of aspirin also comes in a chewable form. ? Enteric-coated. This type of aspirin has a special coating that releases the medicine very slowly. Enteric-coated aspirin causes less stomach upset than non-enteric-coated aspirin. This type of aspirin should not be chewed or crushed.  Drink alcohol in moderation. Drinking alcohol increases your risk of bleeding. When should I seek medical care?  You have unusual bleeding or bruising.  You have stomach pain.  You have an allergic reaction. Symptoms of an allergic reaction include: ? Hives. ? Itchy skin. ? Swelling of the lips, tongue, or face.  You have ringing in your ears. When should I seek immediate medical care?  Your bowel movements are bloody, dark red, or black in color.  You vomit or cough up blood.  You have blood in your urine.  You cough, wheeze, or feel short of breath. If you have any of the following symptoms, this is an emergency. Do not wait to see if the pain will go away. Get medical help at once. Call your local emergency services (911 in the [place] Do not drive yourself to the hospital.  You have severe chest pain, especially if the pain is crushing or pressure-like and spreads to the arms, back, neck, or jaw.  You have stroke-like symptoms, such as: ? Loss of  vision. ? Difficulty talking. ? Numbness or weakness on one side of your body. ? Numbness or weakness in your arm or leg. ? Not thinking clearly or feeling confused.  This information is not intended to replace advice given to you by your health care provider. Make sure you discuss any questions you have with your health care provider. Document Released: 03/06/2008 Document Revised: 08/01/2015 Document Reviewed: 06/29/2013 Elsevier Interactive Patient Education  2018 [employer].   Stroke Prevention Some medical conditions and behaviors are associated with a higher chance of having a stroke. You can help prevent a stroke by making nutrition, lifestyle, and other changes, including managing any medical conditions you may have. What nutrition changes can be made?  Eat healthy foods. You can do this by: ? Choosing foods high in fiber, such as fresh fruits and vegetables and whole grains. ? Eating at least 5 or more servings of fruits and vegetables a day. Try to fill half of your plate at each meal with fruits and vegetables. ? Choosing lean protein foods, such as lean cuts of meat, poultry without skin, fish, tofu, beans, and nuts. ? Eating low-fat dairy products. ? Avoiding foods that are high in salt (sodium). This can help lower blood pressure. ? Avoiding  foods that have saturated fat, trans fat, and cholesterol. This can help prevent high cholesterol. ? Avoiding processed and premade foods.  Follow your health care provider's specific guidelines for losing weight, controlling high blood pressure (hypertension), lowering high cholesterol, and managing diabetes. These may include: ? Reducing your daily calorie intake. ? Limiting your daily sodium intake to 1,500 milligrams (mg). ? Using only healthy fats for cooking, such as olive oil, canola oil, or sunflower oil. ? Counting your daily carbohydrate intake. What lifestyle changes can be made?  Maintain a healthy weight. Talk to your  health care provider about your ideal weight.  Get at least 30 minutes of moderate physical activity at least 5 days a week. Moderate activity includes brisk walking, biking, and swimming.  Do not use any products that contain nicotine or tobacco, such as cigarettes and e-cigarettes. If you need help quitting, ask your health care provider. It may also be helpful to avoid exposure to secondhand smoke.  Limit alcohol intake to no more than 1 drink a day for nonpregnant women and 2 drinks a day for men. One drink equals 12 oz of beer, 5 oz of wine, or 1 oz of hard liquor.  Stop any illegal drug use.  Avoid taking birth control pills. Talk to your health care provider about the risks of taking birth control pills if: ? You are over 55 years old. ? You smoke. ? You get migraines. ? You have ever had a blood clot. What other changes can be made?  Manage your cholesterol levels. ? Eating a healthy diet is important for preventing high cholesterol. If cholesterol cannot be managed through diet alone, you may also need to take medicines. ? Take any prescribed medicines to control your cholesterol as told by your health care provider.  Manage your diabetes. ? Eating a healthy diet and exercising regularly are important parts of managing your blood sugar. If your blood sugar cannot be managed through diet and exercise, you may need to take medicines. ? Take any prescribed medicines to control your diabetes as told by your health care provider.  Control your hypertension. ? To reduce your risk of stroke, try to keep your blood pressure below 130/80. ? Eating a healthy diet and exercising regularly are an important part of controlling your blood pressure. If your blood pressure cannot be managed through diet and exercise, you may need to take medicines. ? Take any prescribed medicines to control hypertension as told by your health care provider. ? Ask your health care provider if you should monitor  your blood pressure at home. ? Have your blood pressure checked every year, even if your blood pressure is normal. Blood pressure increases with age and some medical conditions.  Get evaluated for sleep disorders (sleep apnea). Talk to your health care provider about getting a sleep evaluation if you snore a lot or have excessive sleepiness.  Take over-the-counter and prescription medicines only as told by your health care provider. Aspirin or blood thinners (antiplatelets or anticoagulants) may be recommended to reduce your risk of forming blood clots that can lead to stroke.  Make sure that any other medical conditions you have, such as atrial fibrillation or atherosclerosis, are managed. What are the warning signs of a stroke? The warning signs of a stroke can be easily remembered as BEFAST.  B is for balance. Signs include: ? Dizziness. ? Loss of balance or coordination. ? Sudden trouble walking.  E is for eyes. Signs include: ? A  sudden change in vision. ? Trouble seeing.  F is for face. Signs include: ? Sudden weakness or numbness of the face. ? The face or eyelid drooping to one side.  A is for arms. Signs include: ? Sudden weakness or numbness of the arm, usually on one side of the body.  S is for speech. Signs include: ? Trouble speaking (aphasia). ? Trouble understanding.  T is for time. ? These symptoms may represent a serious problem that is an emergency. Do not wait to see if the symptoms will go away. Get medical help right away. Call your local emergency services (911 in the [place] Do not drive yourself to the hospital.  Other signs of stroke may include: ? A sudden, severe headache with no known cause. ? Nausea or vomiting. ? Seizure.  Where to find more information: For more information, visit:  American Stroke Association: www.strokeassociation.org  National Stroke Association: www.stroke.org  Summary  You can prevent a stroke by eating healthy,  exercising, not smoking, limiting alcohol intake, and managing any medical conditions you may have.  Do not use any products that contain nicotine or tobacco, such as cigarettes and e-cigarettes. If you need help quitting, ask your health care provider. It may also be helpful to avoid exposure to secondhand smoke.  Remember BEFAST for warning signs of stroke. Get help right away if you or a loved one has any of these signs. This information is not intended to replace advice given to you by your health care provider. Make sure you discuss any questions you have with your health care provider. Document Released: 05/01/2004 Document Revised: 04/29/2016 Document Reviewed: 04/29/2016 Elsevier Interactive Patient Education  [employer]2018 Elsevier Inc.

## 2017-06-05 LAB — HIV ANTIBODY (ROUTINE TESTING W REFLEX): HIV SCREEN 4TH GENERATION: NONREACTIVE

## 2017-06-16 ENCOUNTER — Ambulatory Visit (HOSPITAL_COMMUNITY): Payer: Self-pay

## 2017-06-24 ENCOUNTER — Ambulatory Visit (HOSPITAL_COMMUNITY): Payer: Self-pay

## 2018-12-15 IMAGING — MR MR HEAD W/O CM
8 of 10 series · 40 of 48 positions shown · non-contrast
Comparison: Head CT from yesterday

CLINICAL DATA: Left-sided facial numbness for 1 day.

EXAM:
MRI HEAD WITHOUT CONTRAST
TECHNIQUE: Multiplanar, multiecho pulse sequences of the brain and surrounding
structures were obtained without intravenous contrast.

[Series 4: DWI · axial · 3.0mm · 0.82mm/px · z∈[-130,+31]mm · 7 of 55 slices shown (1 of 4)]
[im 1/55]
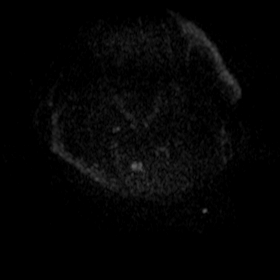
[im 10/55]
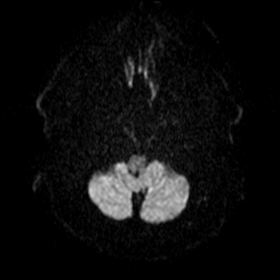
[im 19/55]
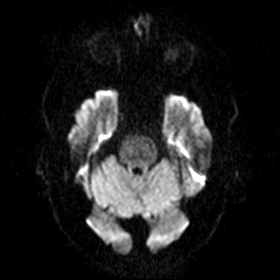
[im 28/55]
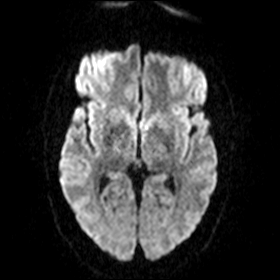
[im 37/55]
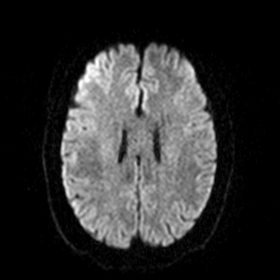
[im 46/55]
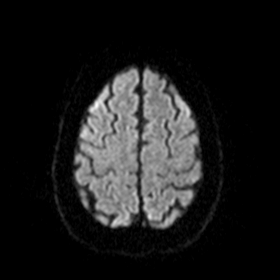
[im 55/55]
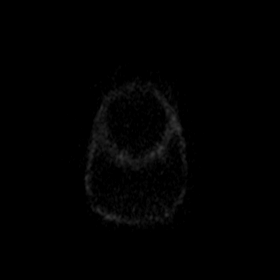

[Series 5: DWI · axial · 3.0mm · 0.82mm/px · z∈[-130,+31]mm · 6 of 54 slices shown (2 of 4)]
[im 1/54]
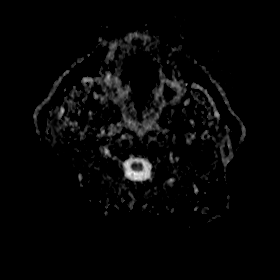
[im 11/54]
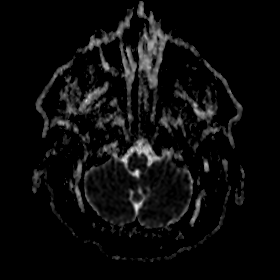
[im 22/54]
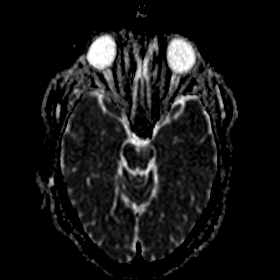
[im 32/54]
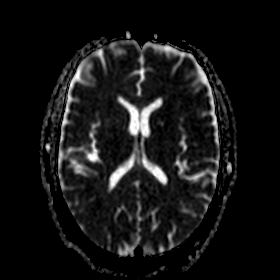
[im 43/54]
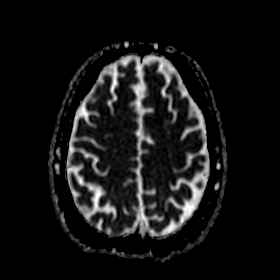
[im 54/54]
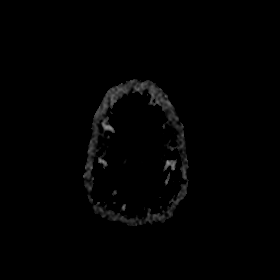

[Series 6: DWI · coronal · 5.0mm · 0.51mm/px · 4 of 34 slices shown (3 of 4)]
[im 1/34]
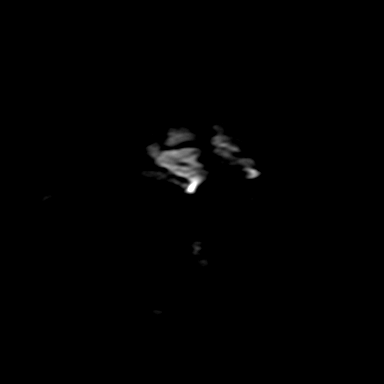
[im 12/34]
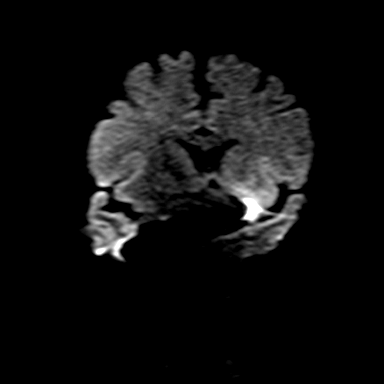
[im 23/34]
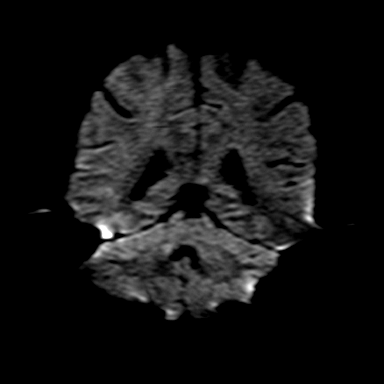
[im 34/34]
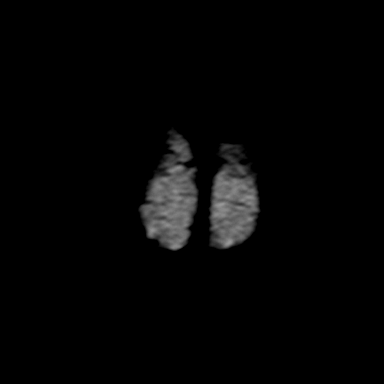

[Series 7: DWI · coronal · 5.0mm · 0.51mm/px · 4 of 34 slices shown (4 of 4)]
[im 1/34]
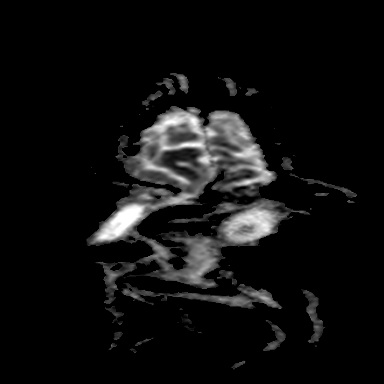
[im 12/34]
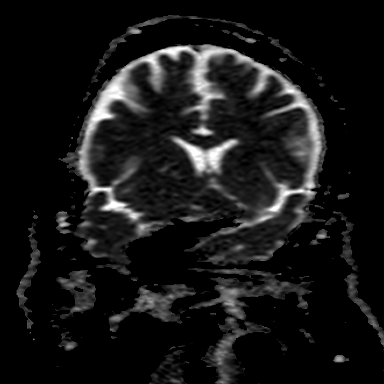
[im 23/34]
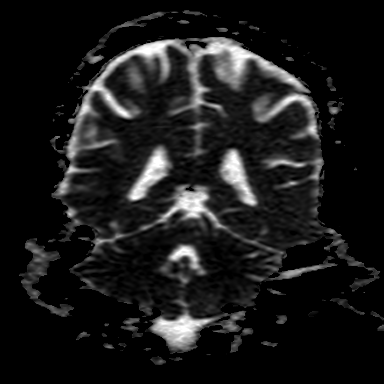
[im 34/34]
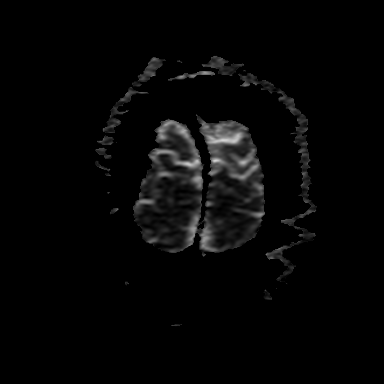

[Series 8: T2 · axial · 5.0mm · 0.75mm/px · z∈[-122,+21]mm · 3 of 23 slices shown (1 of 2)]
[im 1/23]
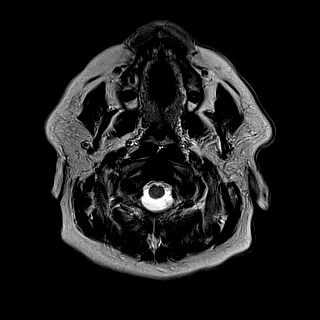
[im 12/23]
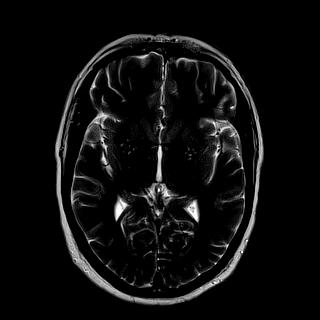
[im 23/23]
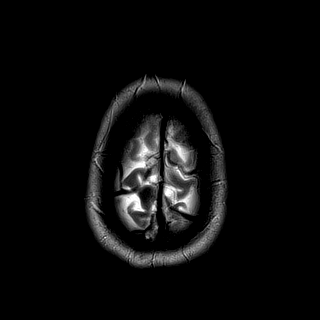

[Series 9: FLAIR · axial · 3.0mm · 0.94mm/px · z∈[-119,+18]mm · 5 of 47 slices shown]
[im 1/47]
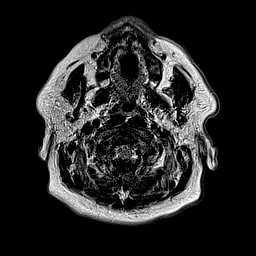
[im 12/47]
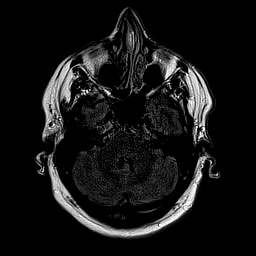
[im 24/47]
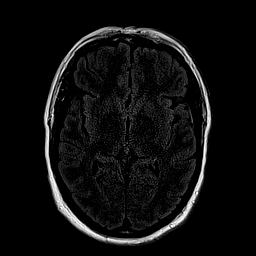
[im 35/47]
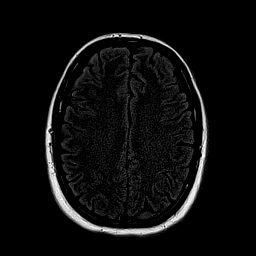
[im 47/47]
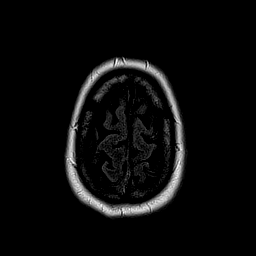

[Series 10: T1 · axial · 2.0mm · 0.47mm/px · z∈[-133,+53]mm · 8 of 95 slices shown]
[im 1/95]
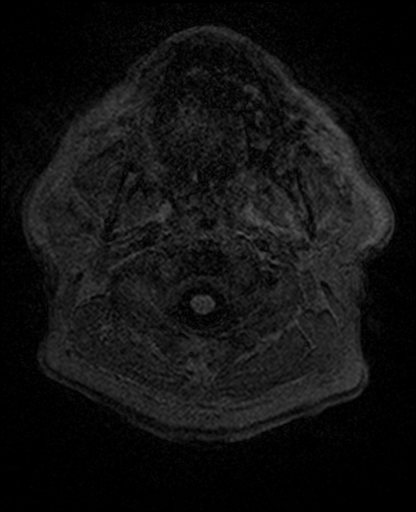
[im 19/95]
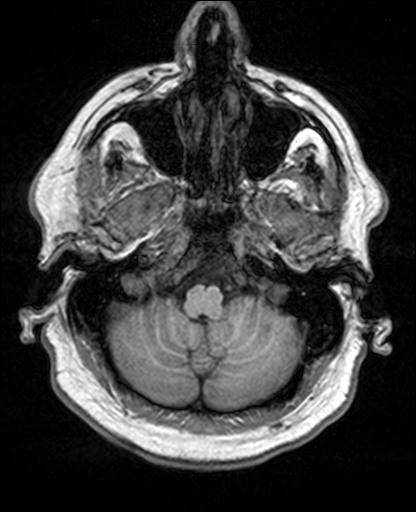
[im 29/95]
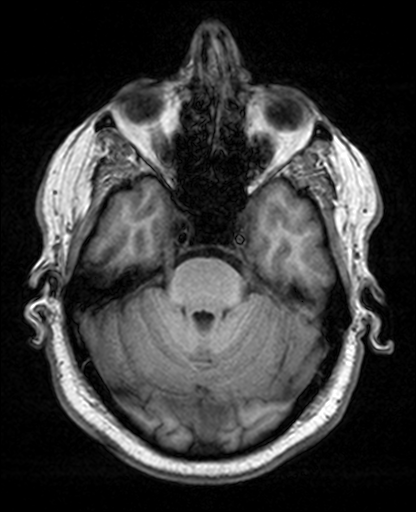
[im 38/95]
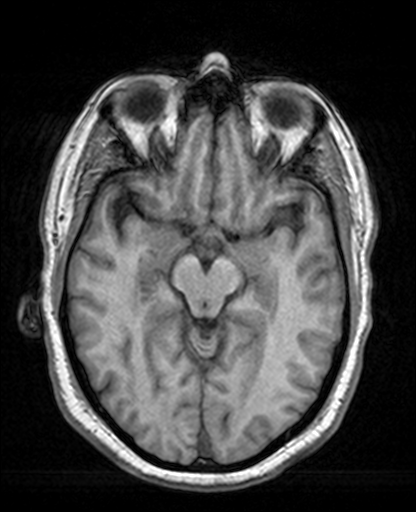
[im 57/95]
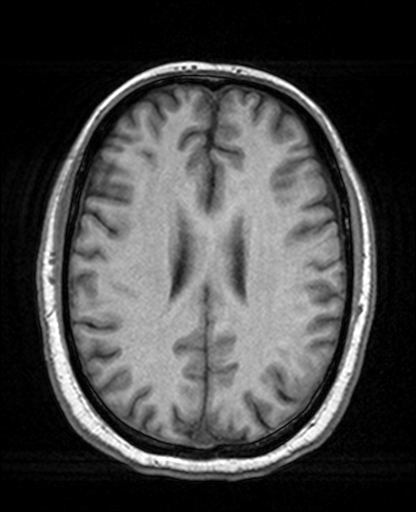
[im 66/95]
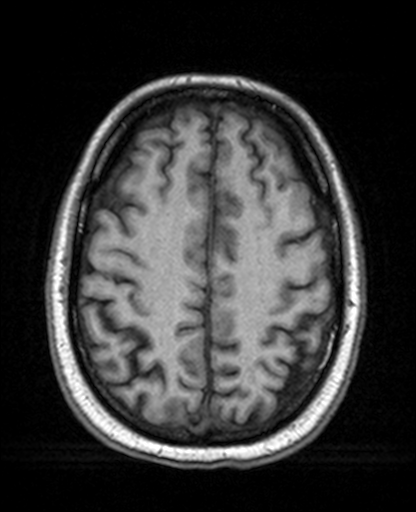
[im 76/95]
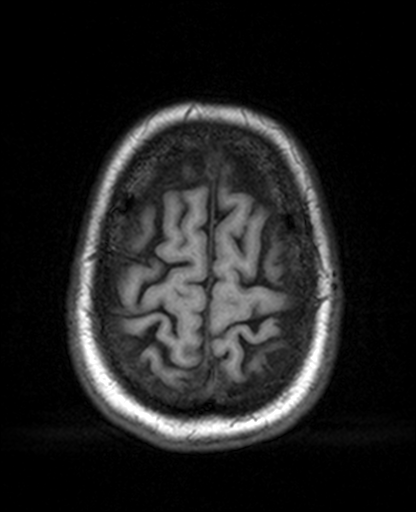
[im 95/95]
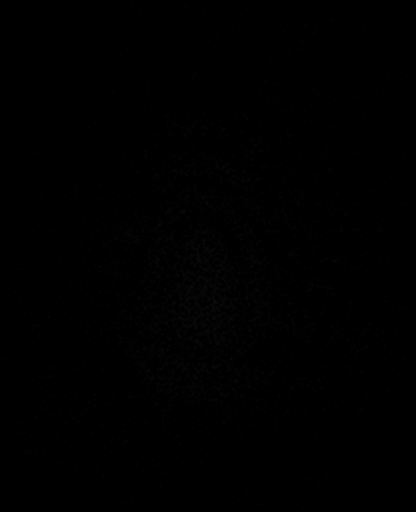

[Series 12: T2 · coronal · 5.0mm · 0.66mm/px · 3 of 28 slices shown (2 of 2)]
[im 1/28]
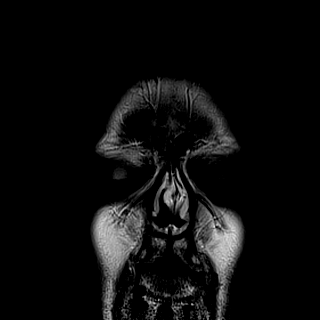
[im 14/28]
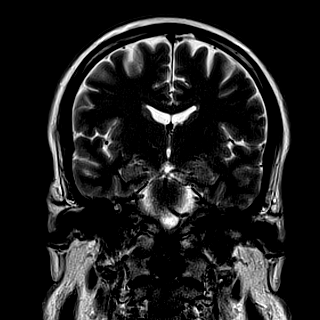
[im 28/28]
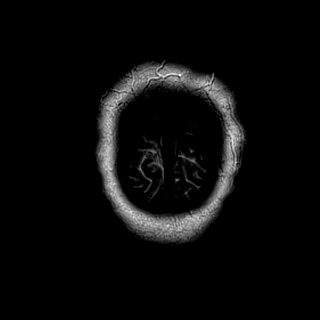

[40 of 48 positions shown; findings below may reference images not displayed]

FINDINGS: Brain: No acute infarction, hemorrhage, hydrocephalus, extra-axial
collection or mass lesion. Normal brain volume. No findings along
the left trigeminal nerve. No demyelinating pattern.

Vascular: Major flow voids are preserved.

Skull and upper cervical spine: Normal marrow signal.

Sinuses/Orbits: Mild mucosal thickening in the paranasal sinuses. No
acute sinusitis to explain symptoms. Anterior nasal septum
perforation.
IMPRESSION: 1. No acute finding or explanation for symptoms.
2. Mild generalized mucosal thickening in the paranasal sinuses.
Anterior nasal septum perforation.

## 2019-01-28 ENCOUNTER — Other Ambulatory Visit: Payer: Self-pay | Admitting: *Deleted

## 2019-01-28 DIAGNOSIS — Z20822 Contact with and (suspected) exposure to covid-19: Secondary | ICD-10-CM

## 2019-01-30 LAB — NOVEL CORONAVIRUS, NAA: SARS-CoV-2, NAA: NOT DETECTED

## 2019-05-06 ENCOUNTER — Ambulatory Visit: Payer: MEDICAID | Attending: Internal Medicine

## 2019-05-06 ENCOUNTER — Other Ambulatory Visit: Payer: Self-pay

## 2019-05-06 DIAGNOSIS — Z20822 Contact with and (suspected) exposure to covid-19: Secondary | ICD-10-CM

## 2019-05-07 LAB — NOVEL CORONAVIRUS, NAA: SARS-CoV-2, NAA: NOT DETECTED

## 2019-11-04 ENCOUNTER — Other Ambulatory Visit: Payer: Self-pay

## 2019-11-04 ENCOUNTER — Encounter (HOSPITAL_COMMUNITY): Payer: Self-pay

## 2019-11-04 ENCOUNTER — Emergency Department (HOSPITAL_COMMUNITY)
Admission: EM | Admit: 2019-11-04 | Discharge: 2019-11-04 | Disposition: A | Discharge status: Home / Self Care | Payer: Self-pay | Attending: Emergency Medicine | Admitting: Emergency Medicine

## 2019-11-04 ENCOUNTER — Emergency Department (HOSPITAL_COMMUNITY): Payer: Self-pay

## 2019-11-04 DIAGNOSIS — R61 Generalized hyperhidrosis: Secondary | ICD-10-CM | POA: Insufficient documentation

## 2019-11-04 DIAGNOSIS — Z7982 Long term (current) use of aspirin: Secondary | ICD-10-CM | POA: Insufficient documentation

## 2019-11-04 DIAGNOSIS — Z8673 Personal history of transient ischemic attack (TIA), and cerebral infarction without residual deficits: Secondary | ICD-10-CM | POA: Insufficient documentation

## 2019-11-04 DIAGNOSIS — Z87891 Personal history of nicotine dependence: Secondary | ICD-10-CM | POA: Insufficient documentation

## 2019-11-04 DIAGNOSIS — R0789 Other chest pain: Secondary | ICD-10-CM | POA: Insufficient documentation

## 2019-11-04 DIAGNOSIS — I10 Essential (primary) hypertension: Secondary | ICD-10-CM | POA: Insufficient documentation

## 2019-11-04 DIAGNOSIS — Z79899 Other long term (current) drug therapy: Secondary | ICD-10-CM | POA: Insufficient documentation

## 2019-11-04 LAB — BASIC METABOLIC PANEL
Anion gap: 10 (ref 5–15)
BUN: 15 mg/dL (ref 6–20)
CO2: 24 mmol/L (ref 22–32)
Calcium: 8.7 mg/dL — ABNORMAL LOW (ref 8.9–10.3)
Chloride: 105 mmol/L (ref 98–111)
Creatinine, Ser: 1.1 mg/dL (ref 0.61–1.24)
GFR calc Af Amer: >60 mL/min (ref >60)
GFR calc non Af Amer: >60 mL/min (ref >60)
Glucose, Bld: 100 mg/dL — ABNORMAL HIGH (ref 70–99)
Potassium: 3.7 mmol/L (ref 3.5–5.1)
Sodium: 139 mmol/L (ref 135–145)

## 2019-11-04 LAB — TROPONIN I (HIGH SENSITIVITY)
Troponin I (High Sensitivity): 3 ng/L (ref <18)
Troponin I (High Sensitivity): 4 ng/L (ref <18)

## 2019-11-04 LAB — CBC
HCT: 46.7 % (ref 39.0–52.0)
Hemoglobin: 15.7 g/dL (ref 13.0–17.0)
MCH: 32.1 pg (ref 26.0–34.0)
MCHC: 33.6 g/dL (ref 30.0–36.0)
MCV: 95.5 fL (ref 80.0–100.0)
Platelets: 261 10*3/uL (ref 150–400)
RBC: 4.89 MIL/uL (ref 4.22–5.81)
RDW: 13.1 % (ref 11.5–15.5)
WBC: 9.1 10*3/uL (ref 4.0–10.5)
nRBC: 0 % (ref 0.0–0.2)

## 2019-11-04 MED ORDER — SODIUM CHLORIDE 0.9% FLUSH: 3.0000 mL | Freq: Once | INTRAVENOUS | Status: DC

## 2019-11-04 NOTE — ED Triage Notes (Signed)
Pt reports had been weed eating this morning and says started having pain in r side of neck, chest pain, and HR 116.  Reports according to his watch, says his hr has been fluctuating.  PT c/o feeling light headed.  Pt says woke up in the middle of the night with  "a little twinge" in his chest.  Says has felt it several times in the past 3 or 4 days.  Reports it happened again this morning.  Denies any cp at this time.

## 2019-11-04 NOTE — Discharge Instructions (Signed)
Follow-up with your primary care provider and the cardiologist listed below. Return to the ER if you start to experience worsening chest pain, shortness of breath, leg swelling, vomiting or coughing up blood.

## 2019-11-04 NOTE — ED Provider Notes (Signed)
[facility] EMERGENCY DEPARTMENT Provider Note   CSN: [id number] Arrival date & time: 11/04/19  1032     History Chief Complaint  Patient presents with  . Chest Pain    Kiril Hippe is a 59 y.o. male who presents to ED with a chief complaint of chest pain.  For the past 3 to 4 days has noticed a "twinge in my chest."  This has been intermittent without specific aggravating or alleviating factor.  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  This morning he was weed eating outside after drinking 2 cups of coffee.  Started having worsening sensation in his chest as well as pain to the right side of his neck.  States that he sat down in his truck and noticed that his heart rate was 116.  After rest to return to 80s to 90s.  He reports feeling diaphoretic" I saw myself in the mirror and I looked pale."  His symptoms resolved without intervention.  He denies any leg swelling, abdominal pain, nausea, vomiting, cough, recent immobilization, history of DVT, PE, MI. Denies tobacco use.  HPI     History reviewed. No pertinent past medical history.  Patient Active Problem List   Diagnosis Date Noted  . 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 Hyperglycemia 06/03/2017  . Elevated blood pressure reading without diagnosis of hypertension 06/03/2017    Past Surgical History:  Procedure Laterality Date  . orhtopedic surgery         No family history on file.  Social History   Tobacco Use  . Smoking status: Former Smoker    Types: Cigarettes  . Smokeless tobacco: Never Used  Vaping Use  . Vaping Use: Never used  Substance Use Topics  . Alcohol use: Yes    Comment: occ  . Drug use: No    Home Medications Prior to Admission medications   Medication Sig Start Date End Date Taking? Authorizing Provider  Multiple Vitamin (MULTIVITAMIN WITH MINERALS) TABS tablet Take 1 tablet by mouth daily.   Yes [provider]  Omega-3 1000 MG CAPS Take  1 capsule by mouth daily.   Yes [provider]  aspirin EC 81 MG EC tablet Take 1 tablet (81 mg total) by mouth daily. Patient not taking: Reported on 11/04/2019 06/05/17   Cleora Fleet, MD  atorvastatin (LIPITOR) 10 MG tablet Take 1 tablet (10 mg total) by mouth daily at 6 PM. 06/04/17 07/04/17  Cleora Fleet, MD    Allergies    Patient has no known allergies.  Review of Systems   Review of Systems  Constitutional: Positive for diaphoresis. Negative for appetite change, chills and fever.  HENT: Negative for ear pain, rhinorrhea, sneezing and sore throat.   Eyes: Negative for photophobia and visual disturbance.  Respiratory: Negative for cough, chest tightness, shortness of breath and wheezing.   Cardiovascular: Positive for chest pain. Negative for palpitations.  Gastrointestinal: Negative for abdominal pain, blood in stool, constipation, diarrhea, nausea and vomiting.  Genitourinary: Negative for dysuria, hematuria and urgency.  Musculoskeletal: Negative for myalgias.  Skin: Negative for rash.  Neurological: Negative for dizziness, weakness and light-headedness.    Physical Exam Updated Vital Signs BP (!) 152/87   Pulse 63   Temp 98.1 F (36.7 C) (Oral)   Resp (!) 11   Ht 5\' 11"  (1.803 m)   Wt (!) 97.5 kg   SpO2 100%   BMI 29.99 kg/m   Physical Exam  Vitals and nursing note reviewed.  Constitutional:      General: He is not in acute distress.    Appearance: He is well-developed.     Comments: Speaking complete sentences without difficulty.  No signs of respiratory distress.  HENT:     Head: Normocephalic and atraumatic.     Nose: Nose normal.  Eyes:     General: No scleral icterus.       Left eye: No discharge.     Conjunctiva/sclera: Conjunctivae normal.  Cardiovascular:     Rate and Rhythm: Normal rate and regular rhythm.     Heart sounds: Normal heart sounds. No murmur heard.  No friction rub. No gallop.   Pulmonary:     Effort: Pulmonary  effort is normal. No respiratory distress.     Breath sounds: Normal breath sounds.  Abdominal:     General: Bowel sounds are normal. There is no distension.     Palpations: Abdomen is soft.     Tenderness: There is no abdominal tenderness. There is no guarding.  Musculoskeletal:        General: Normal range of motion.     Cervical back: Normal range of motion and neck supple.     Right lower leg: No tenderness. No edema.     Left lower leg: No tenderness. No edema.  Skin:    General: Skin is warm and dry.     Findings: No rash.  Neurological:     Mental Status: He is alert.     Motor: No abnormal muscle tone.     Coordination: Coordination normal.     ED Results / Procedures / Treatments   Labs (all labs ordered are listed, but only abnormal results are displayed) Labs Reviewed  BASIC METABOLIC PANEL - Abnormal; Notable for the following components:      Result Value   Glucose, Bld 100 (*)    Calcium 8.7 (*)    All other components within normal limits  CBC  TROPONIN I (HIGH SENSITIVITY)  TROPONIN I (HIGH SENSITIVITY)    EKG EKG Interpretation  Date/Time:  Friday November 04 2019 10:47:04 EDT Ventricular Rate:  61 PR Interval:    QRS Duration: 97 QT Interval:  421 QTC Calculation: 424 R Axis:   61 Text Interpretation: Sinus rhythm Anteroseptal infarct, old No STEMI Confirmed by Alona Bene [phone]) on 11/04/2019 10:50:06 AM   Radiology DG Chest 2 View  Result Date: 11/04/2019 CLINICAL DATA:  Chest pain, shortness of breath, weakness EXAM: CHEST - 2 VIEW COMPARISON:  08/17/2014 FINDINGS: Heart and mediastinal contours are within normal limits. No focal opacities or effusions. No acute bony abnormality. IMPRESSION: No active cardiopulmonary disease. Electronically Signed   By: Charlett Nose M.D.   On: 11/04/2019 11:23    Procedures Procedures (including critical care time)  Medications Ordered in ED Medications  sodium chloride flush (NS) 0.9 % injection 3 mL (has  no administration in time range)    ED Course  I have reviewed the triage vital signs and the nursing notes.  Pertinent labs & imaging results that were available during my care of the patient were reviewed by me and considered in my medical decision making (see chart for details).    MDM Rules/Calculators/A&P                          59 year old male presenting to the ED with a chief complaint of chest pain. Reports a "twinge" in his  chest for the past 3 to 4 days that has been intermittent without specific aggravating or alleviating factor. Symptoms got worse this morning after he was weed eating outside after drinking 2 cups of coffee. Noticed that his heart rate was 116, this improved to 80s to 90s with rest. Started feeling diaphoretic and pale as well. 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 Does report increased stress in his life recently. Quit smoking tobacco several years ago. Denies any leg swelling, history of DVT or PE, MI or recent immobilization. On exam patient is overall well-appearing. Speaking complete sentences without difficulty. No signs of respiratory distress or airway compromise. No lower extremity edema, erythema or calf tenderness of concern me for DVT. Abdomen is soft, nontender nondistended. EKG shows sinus rhythm, no ischemic changes. Chest x-ray is unremarkable. CBC, BMP and troponin are negative x2. I doubt PE as a cause of his symptoms as his vital signs are reassuring, he has no risk factors and is denying any hemoptysis or pleuritic chest pain. He remained symptom-free during ED visit. I suspect that his symptoms could be due to increased stress versus increased physical activity in the setting of decreased hydration. Informed patient that he will need to increase his p.o. intake prior to heavy manual labor. Wife also admits that he has been "stressed about  the family business, he just works nonstop." Suspect that he will benefit from PCP and cardiology follow-up for testing routinely. Patient is agreeable to discharge home. Strict return precautions given.   Patient is hemodynamically stable, in NAD, and able to ambulate in the ED. Evaluation does not show pathology that would require ongoing emergent intervention or inpatient treatment. I explained the diagnosis to the patient. Pain has been managed and has no complaints prior to discharge. Patient is comfortable with above plan and is stable for discharge at this time. All questions were answered prior to disposition. Strict return precautions for returning to the ED were discussed. Encouraged follow up with PCP.   An After Visit Summary was printed and given to the patient.   Portions of this note were generated with Scientist, clinical (histocompatibility and immunogenetics). Dictation errors may occur despite best attempts at proofreading.  Final Clinical Impression(s) / ED Diagnoses Final diagnoses:  Chest wall pain    Rx / DC Orders ED Discharge Orders    None       Dietrich Pates, PA-C 11/04/19 1354    Long, Arlyss Repress, MD 11/10/19 [phone]
